# Patient Record
Sex: Male | Born: 2000 | Race: Black or African American | Hispanic: No | Marital: Married | Smoking: Never smoker
Health system: Southern US, Community
[De-identification: ages and names within clinical notes are randomized; demographics above are authoritative.]

## PROBLEM LIST (undated history)

## (undated) DIAGNOSIS — J45909 Unspecified asthma, uncomplicated: Secondary | ICD-10-CM

## (undated) DIAGNOSIS — K219 Gastro-esophageal reflux disease without esophagitis: Secondary | ICD-10-CM

## (undated) HISTORY — PX: TONSILLECTOMY: SUR1361

---
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
## 2019-01-18 ENCOUNTER — Encounter: Payer: Self-pay | Admitting: Emergency Medicine

## 2019-01-18 ENCOUNTER — Emergency Department
Admission: EM | Admit: 2019-01-18 | Discharge: 2019-01-18 | Disposition: A | Discharge status: Home / Self Care | Payer: Medicaid Other | Attending: Emergency Medicine | Admitting: Emergency Medicine

## 2019-01-18 ENCOUNTER — Other Ambulatory Visit: Payer: Self-pay

## 2019-01-18 ENCOUNTER — Emergency Department: Payer: Medicaid Other

## 2019-01-18 DIAGNOSIS — R079 Chest pain, unspecified: Secondary | ICD-10-CM | POA: Diagnosis not present

## 2019-01-18 DIAGNOSIS — J45909 Unspecified asthma, uncomplicated: Secondary | ICD-10-CM | POA: Diagnosis not present

## 2019-01-18 LAB — CBC
HCT: 43.1 % (ref 39.0–52.0)
Hemoglobin: 14.2 g/dL (ref 13.0–17.0)
MCH: 27 pg (ref 26.0–34.0)
MCHC: 32.9 g/dL (ref 30.0–36.0)
MCV: 81.9 fL (ref 80.0–100.0)
Platelets: 267 10*3/uL (ref 150–400)
RBC: 5.26 MIL/uL (ref 4.22–5.81)
RDW: 13.7 % (ref 11.5–15.5)
WBC: 8.3 10*3/uL (ref 4.0–10.5)
nRBC: 0 % (ref 0.0–0.2)

## 2019-01-18 LAB — BASIC METABOLIC PANEL
Anion gap: 8 (ref 5–15)
BUN: 16 mg/dL (ref 6–20)
CO2: 25 mmol/L (ref 22–32)
Calcium: 9.7 mg/dL (ref 8.9–10.3)
Chloride: 105 mmol/L (ref 98–111)
Creatinine, Ser: 0.78 mg/dL (ref 0.61–1.24)
GFR calc Af Amer: >60 mL/min (ref >60)
GFR calc non Af Amer: >60 mL/min (ref >60)
Glucose, Bld: 93 mg/dL (ref 70–99)
Potassium: 3.9 mmol/L (ref 3.5–5.1)
Sodium: 138 mmol/L (ref 135–145)

## 2019-01-18 LAB — TROPONIN I (HIGH SENSITIVITY): Troponin I (High Sensitivity): 4 ng/L (ref <18)

## 2019-01-18 MED ORDER — SODIUM CHLORIDE 0.9% FLUSH: 3.0000 mL | Freq: Once | INTRAVENOUS | Status: DC

## 2019-01-18 NOTE — ED Notes (Signed)
Pt waiting patiently for treatment room; awake and alert; 

## 2019-01-18 NOTE — ED Triage Notes (Signed)
Pt to ED via POV c/o chest pain that started about 2 hours PTA. Pt was seen 12/28/18 for same, was told it was acid reflux or asthma. Pt states that pain got worse after using medication for asthma. Pt denies any other symptoms. Pt is in NAD.

## 2019-01-18 NOTE — ED Notes (Signed)
Patient continues to wait patiently for treatment room;

## 2019-01-18 NOTE — ED Notes (Signed)
No peripheral IV placed this visit.   Discharge instructions reviewed with patient. Questions fielded by this RN. Patient verbalizes understanding of instructions. Patient discharged home in stable condition per williiams. No acute distress noted at time of discharge.

## 2019-01-18 NOTE — ED Provider Notes (Signed)
[facility] Emergency Department Provider Note       Time seen: ----------------------------------------- 9:55 PM on 01/18/2019 -----------------------------------------   I have reviewed the triage vital signs and the nursing notes.  HISTORY   Chief Complaint Chest Pain    HPI Ethan Brewer is a 18 y.o. male with a history of asthma, GERD who presents to the ED for chest pain.  Patient chest pain is started 2 hours prior to arrival.  He was seen similarly for same recently.  He denies any acid reflux or asthma.  Patient states he got worse after using his inhaler.  He denies any other complaints.  Past Medical History:  Diagnosis Date  . Asthma   . GERD (gastroesophageal reflux disease)     Patient Active Problem List   Diagnosis Date Noted  . Subscapular bursitis 12/05/2016  . 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 Abnormal ECG 07/30/2013    Past Surgical History:  Procedure Laterality Date  . TONSILLECTOMY      Allergies Patient has no known allergies.  Social History Social History   Tobacco Use  . Smoking status: Never Smoker  . Smokeless tobacco: Never Used  Substance Use Topics  . Alcohol use: Never    Frequency: Never  . Drug use: Never   Review of Systems Constitutional: Negative for fever. Cardiovascular: Positive for chest pain Respiratory: Negative for shortness of breath. Gastrointestinal: Negative for abdominal pain, vomiting and diarrhea. Musculoskeletal: Negative for back pain. Skin: Negative for rash. Neurological: Negative for headaches, focal weakness or numbness.  All systems negative/normal/unremarkable except as stated in the HPI  ____________________________________________   PHYSICAL EXAM:  VITAL SIGNS: ED Triage Vitals  Enc Vitals Group     BP 01/18/19 1709 131/66     Pulse Rate 01/18/19 1709 62     Resp 01/18/19 1709 16     Temp 01/18/19 1709 98.9 F (37.2 C)     Temp Source 01/18/19 1709 Oral     SpO2 01/18/19 1709 98 %     Weight --      Height --      Head Circumference --      Peak Flow --      Pain Score 01/18/19 1702 6     Pain Loc --      Pain Edu? --      Excl. in [place]? --    Constitutional: Alert and oriented. Well appearing and in no distress. Eyes: Conjunctivae are normal. Normal extraocular movements. Cardiovascular: Normal rate, regular rhythm. No murmurs, rubs, or gallops. Respiratory: Normal respiratory effort without tachypnea nor retractions. Breath sounds are clear and equal bilaterally. No wheezes/rales/rhonchi. Gastrointestinal: Soft and nontender. Normal bowel sounds Musculoskeletal: Nontender with normal range of motion in extremities. No lower extremity tenderness nor edema. Neurologic:  Normal speech and language. No gross focal neurologic deficits are appreciated.  Skin:  Skin is warm, dry and intact. No rash noted. Psychiatric: Mood and affect are normal. Speech and behavior are normal.  ____________________________________________  EKG: Interpreted by me.  Sinus rhythm with a rate of 74 bpm, rightward axis, normal QT  ____________________________________________  ED COURSE:  As part of my medical decision making, I reviewed the following data within the electronic MEDICAL RECORD NUMBER History obtained from family if available, nursing notes, old chart and ekg, as well as notes from prior ED visits. Patient presented for chest pain, we will assess with labs and imaging as indicated at this time.   Procedures  Ethan Brewer  was evaluated in Emergency Department on 01/18/2019 for the symptoms described in the history of present illness. He was evaluated in the context of the global COVID-19 pandemic, which necessitated consideration that the patient might be at risk for infection with the SARS-CoV-2 virus that causes COVID-19. Institutional protocols and algorithms that pertain to the evaluation of patients at risk for COVID-19 are in a state of rapid  change based on information released by regulatory bodies including the CDC and federal and state organizations. These policies and algorithms were followed during the patient's care in the ED.  ____________________________________________   LABS (pertinent positives/negatives)  Labs Reviewed  BASIC METABOLIC PANEL  CBC  TROPONIN I (HIGH SENSITIVITY)  TROPONIN I (HIGH SENSITIVITY)    RADIOLOGY  Chest x-ray is unremarkable  ____________________________________________   DIFFERENTIAL DIAGNOSIS   Musculoskeletal pain, anxiety, GERD, unstable angina unlikely  FINAL ASSESSMENT AND PLAN  Nonspecific chest pain   Plan: The patient had presented for likely noncardiac chest pain. Patient's labs are reassuring and he is low risk for ACS according to heart score. Patient's imaging was normal.  He is cleared for outpatient follow-up.   Laurence Aly, MD    Note: This note was generated in part or whole with voice recognition software. Voice recognition is usually quite accurate but there are transcription errors that can and very often do occur. I apologize for any typographical errors that were not detected and corrected.     Earleen Newport, MD 01/18/19 2156

## 2019-01-27 ENCOUNTER — Telehealth: Payer: Self-pay | Admitting: *Deleted

## 2019-01-27 NOTE — Telephone Encounter (Signed)
Patient has been advised to wear a mask for his appointment and to come alone.      COVID-19 Pre-Screening Questions:  . In the past 7 to 10 days have you had a cough,  shortness of breath, headache, congestion, fever (100 or greater) body aches, chills, sore throat, or sudden loss of taste or sense of smell? No . Have you been around anyone with known Covid 19. No . Have you been around anyone who is awaiting Covid 19 test results in the past 7 to 10 days? No . Have you been around anyone who has been exposed to Covid 19, or has mentioned symptoms of Covid 19 within the past 7 to 10 days? No  If you have any concerns/questions about symptoms patients report during screening (either on the phone or at threshold). Contact the provider seeing the patient or DOD for further guidance.  If neither are available contact a member of the leadership team.

## 2019-01-28 ENCOUNTER — Ambulatory Visit: Payer: Self-pay | Admitting: Cardiovascular Disease

## 2019-01-28 ENCOUNTER — Telehealth: Payer: Self-pay | Admitting: Cardiovascular Disease

## 2019-05-02 NOTE — Progress Notes (Deleted)
{Choose 1 Note Type (Telehealth Visit or Telephone Visit):[phone]}   Date:  05/02/2019   ID:  Margo Common, DOB Nov 24, 2000, MRN [id number]  {Patient [phone]::"Home"} {Provider Location:[phone]::"Home"}  PCP:  Ellene Route  Cardiologist:  Sanda Klein, MD  Electrophysiologist:  None   Evaluation Performed:  {Choose Visit Type:[phone]::"Follow-Up Visit"}  Chief Complaint:  Chest pain  History of Present Illness:    Ethan Brewer is a 18 y.o. male with a PMH significant for GERD and asthma. He has a strong family history of early CAD (mother with PCI age 61. He was first seen by Dr. Sallyanne Kuster in clinic following an ER visit for chest pain and palpitations. He complained of dizziness, no syncope. Echo 10/09/18 for palpitations and chest pain showed normal EF 60-65%, no wall motion abnormality, and no valvular disease. Heart monitor 11/11/18 without significant arrhythmias, no correlation with symptoms.   8 out of 18 ER visits in 2020 were for chest pain. He has an additional 8 ER visits at [place] listed in [place] in 2020. He was last seen at [facility] ED 01/18/19 for chest pain that worsened after using his inhaler. He has a history of negative troponins and normal EKG. CBC and BMP were unremarkable, as was his CXR. He was referred to GI after a GI cocktail relieved his chest pain during an ER visit on 12/12/18.  He presents today for follow up.   The patient {does/does not:200015} have symptoms concerning for COVID-19 infection (fever, chills, cough, or new shortness of breath).    Past Medical History:  Diagnosis Date  . Asthma   . GERD (gastroesophageal reflux disease)    Past Surgical History:  Procedure Laterality Date  . TONSILLECTOMY       No outpatient medications have been marked as taking for the 05/06/19 encounter (Appointment) with Ledora Bottcher, [place].     Allergies:   Patient has no known allergies.   Social History    Tobacco Use  . Smoking status: Never Smoker  . Smokeless tobacco: Never Used  Substance Use Topics  . Alcohol use: Never    Frequency: Never  . Drug use: Never     Family Hx: The patient's family history is not on file.  ROS:   Please see the history of present illness.    *** All other systems reviewed and are negative.   Prior CV studies:   The following studies were reviewed today:  ***  Labs/Other Tests and Data Reviewed:    EKG:  {EKG/Telemetry Strips Reviewed:[phone]}  Recent Labs: 10/06/2018: ALT 11 01/18/2019: BUN 16; Creatinine, Ser 0.78; Hemoglobin 14.2; Platelets 267; Potassium 3.9; Sodium 138   Recent Lipid Panel No results found for: CHOL, TRIG, HDL, CHOLHDL, LDLCALC, LDLDIRECT  Wt Readings from Last 3 Encounters:  12/28/18 240 lb (108.9 kg) (99 %, Z= 2.26)*  12/12/18 240 lb (108.9 kg) (99 %, Z= 2.26)*  10/05/18 257 lb (116.6 kg) (>99 %, Z= 2.52)*   * Growth percentiles are based on CDC (Boys, 2-20 Years) data.     Objective:    Vital Signs:  There were no vitals taken for this visit.   {HeartCare Virtual Exam (Optional):[phone]::"VITAL SIGNS:  reviewed"}  ASSESSMENT & PLAN:    1. ***  COVID-19 Education: The signs and symptoms of COVID-19 were discussed with the patient and how to seek care for testing (follow up with PCP or arrange E-visit).  ***The importance of social distancing was discussed today.  Time:   Today, I  have spent *** minutes with the patient with telehealth technology discussing the above problems.     Medication Adjustments/Labs and Tests Ordered: Current medicines are reviewed at length with the patient today.  Concerns regarding medicines are outlined above.   Tests Ordered: No orders of the defined types were placed in this encounter.   Medication Changes: No orders of the defined types were placed in this encounter.   Follow Up:  {F/U Format:[phone]} {follow up:15908}  Signed, Marcelino Duster,  PA  05/02/2019 4:44 PM    [facility]

## 2019-05-06 ENCOUNTER — Telehealth: Payer: Medicaid Other | Admitting: Physician Assistant

## 2019-06-20 ENCOUNTER — Encounter: Payer: Self-pay | Admitting: Cardiovascular Disease

## 2019-06-20 ENCOUNTER — Ambulatory Visit: Payer: Medicaid Other | Admitting: Cardiovascular Disease

## 2019-06-20 ENCOUNTER — Other Ambulatory Visit: Payer: Self-pay

## 2019-06-20 VITALS — BP 132/83 | HR 82 | Temp 98.0°F | Ht 69.0 in | Wt 257.0 lb

## 2019-06-20 DIAGNOSIS — Z8249 Family history of ischemic heart disease and other diseases of the circulatory system: Secondary | ICD-10-CM

## 2019-06-20 DIAGNOSIS — E785 Hyperlipidemia, unspecified: Secondary | ICD-10-CM | POA: Diagnosis not present

## 2019-06-20 DIAGNOSIS — R9431 Abnormal electrocardiogram [ECG] [EKG]: Secondary | ICD-10-CM | POA: Diagnosis not present

## 2019-06-20 DIAGNOSIS — R42 Dizziness and giddiness: Secondary | ICD-10-CM

## 2019-06-20 DIAGNOSIS — Z6835 Body mass index (BMI) 35.0-35.9, adult: Secondary | ICD-10-CM

## 2019-06-20 NOTE — Progress Notes (Signed)
Cardiology Office Note:    Date:  06/20/2019   ID:  Ethan Brewer, DOB January 31, 2001, MRN [id number]  PCP:  Ellene Route  Cardiologist:  Sanda Klein, MD  Electrophysiologist:  None   Referring MD: Ellene Route   Chief Complaint  Patient presents with  . Palpitations    History of Present Illness:    Ethan Brewer is a 18 y.o. male with complaints of dizziness and palpitations that occur independently of each other.  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  The complaints passes spontaneously and he has not had syncope or falls.  Independently, he has occasional isolated skipped beats.  He never has sustained arrhythmia that he is aware of.  He has not had exertional dyspnea or chest discomfort.  He denies edema or claudication.  He has occasional occipital headaches.  When he checks his blood pressure at home is consistently in the 120s/70s.  He also monitors his blood glucose with his mother's glucometer and it has been normal.  Few months ago his echocardiogram showed normal findings, including absence of LVH or of diastolic dysfunction.  All chambers are normal in size, left ventricular systolic function was normal and there were no serious valvular problems.  He wore an arrhythmia monitor that showed completely normal rhythm.  Subjective palpitations did not correlate with any rhythm abnormalities.   There is no family history of cardiomyopathy or unexplained sudden death, but there is a very strong family history of premature CAD.  His mother had a coronary stent around the age of 32.  Both his maternal grandparents had myocardial infarction in their 77s and his grandfather had a defibrillator before he died.  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  His ECG a few months ago showed sinus rhythm with a  rightward axis, borderline voltage criteria for left ventricular hypertrophy (which might be masked by severe obesity) and isolated T wave inversion in leads III and aVF.  Today's tracing is slightly different.  It shows sinus rhythm with incomplete right bundle branch block and only borderline voltage criteria for LVH, and the inferior repolarization abnormalities are gone.  QTc remains normal at 401 ms  Past Medical History:  Diagnosis Date  . Asthma   . GERD (gastroesophageal reflux disease)     Past Surgical History:  Procedure Laterality Date  . TONSILLECTOMY      Current Medications: Current Meds  Medication Sig  . acetaminophen (TYLENOL) 325 MG tablet Take 650 mg by mouth every 6 (six) hours as needed.  Marland Kitchen albuterol (VENTOLIN HFA) 108 (90 Base) MCG/ACT inhaler Inhale 2 puffs into the lungs every 4 (four) hours as needed for wheezing or shortness of breath.  Marland Kitchen LORazepam (ATIVAN) 1 MG tablet TAKE 1 TABLET BY MOUTH EVERY 8 HOURS AS NEEDED FOR ANXIETY  . montelukast (SINGULAIR) 10 MG tablet Take 10 mg by mouth at bedtime.     Allergies:   Patient has no known allergies.   Social History   Socioeconomic History  . Marital status: Married    Spouse name: Not on file  . Number of children: Not on file  . Years of education: Not on file  . Highest education level: Not on file  Occupational History  . Not on file  Tobacco Use  . Smoking status: Never Smoker  . Smokeless tobacco: Never Used  Substance and Sexual Activity  . Alcohol use: Never  . Drug use: Never  . Sexual activity: Not on file  Other Topics Concern  . Not on file  Social History Narrative  . Not on file   Social Determinants of Health   Financial Resource Strain:   . Difficulty of Paying Living Expenses: Not on file  Food Insecurity:   . Worried About Programme researcher, broadcasting/film/video in the Last Year: Not on file  . Ran Out of Food in the Last Year: Not on file  Transportation Needs:   . Lack of Transportation  (Medical): Not on file  . Lack of Transportation (Non-Medical): Not on file  Physical Activity:   . Days of Exercise per Week: Not on file  . Minutes of Exercise per Session: Not on file  Stress:   . Feeling of Stress : Not on file  Social Connections:   . Frequency of Communication with Friends and Family: Not on file  . Frequency of Social Gatherings with Friends and Family: Not on file  . Attends Religious Services: Not on file  . Active Member of Clubs or Organizations: Not on file  . Attends Banker Meetings: Not on file  . Marital Status: Not on file     Family History: The patient's mother and both his maternal grandparents have early onset CAD ROS:   Please see the history of present illness.    All other systems are reviewed and are negative.   EKGs/Labs/Other Studies Reviewed:    The following studies were reviewed today: Echocardiogram April 2020  1. The left ventricle has normal systolic function with an ejection fraction of 60-65%. The cavity size was normal. Left ventricular diastolic parameters were normal.  2. The right ventricle has normal systolic function. The cavity was normal. There is no increase in right ventricular wall thickness. Normal RVSP  3. There is mild dilatation of the aortic root, 3.5 cm  4. The aortic valve is tricuspid. Event monitor May 2020  Mediocre recording quality. Only 46% interpretable (28% uninterpretable due to artifact, not worn remainder of the time).  Normal sinus rhythm with sinus arrhythmia and normal circadian variation.  A single PAC is seen during the entire tracing. There are no PVCs. There is no evidence of complex atrial or ventricular arrhythmia.  There is no correlation between the rhythm and the patient's symptoms. Symptoms were described only sinus bradycardia, normal sinus rhythm and mild sinus tachycardia.   Normal event monitor.  There is no correlation between the reports of palpitations and the  objective rhythm recorded. EKG:  EKG is ordered today.  It shows sinus rhythm, incomplete right bundle branch block, borderline voltage criteria for LVH, normal repolarization pattern, QTc 401 ms Recent Labs: 10/06/2018: ALT 11 01/18/2019: BUN 16; Creatinine, Ser 0.78; Hemoglobin 14.2; Platelets 267; Potassium 3.9; Sodium 138  Recent Lipid Panel No results found for: CHOL, TRIG, HDL, CHOLHDL, VLDL, LDLCALC, LDLDIRECT  Physical Exam:    VS:  BP 132/83   Pulse 82   Temp 98 F (36.7 C)   Ht  (1.753 m)   Wt 257 lb (116.6 kg)   SpO2 96%   BMI 37.95 kg/m     Wt Readings from Last 3 Encounters:  06/20/19 257 lb (116.6 kg) (>99 %, Z= 2.51)*  12/28/18 240 lb (108.9 kg) (99 %, Z= 2.26)*  12/12/18 240 lb (108.9 kg) (99 %, Z= 2.26)*   * Growth percentiles are based on CDC (Boys,  2-20 Years) data.      General: Alert, oriented x3, no distress, he appears muscular and fit, but also obese Head: no evidence of trauma, PERRL, EOMI, no exophtalmos or lid lag, no myxedema, no xanthelasma; normal ears, nose and oropharynx Neck: normal jugular venous pulsations and no hepatojugular reflux; brisk carotid pulses without delay and no carotid bruits Chest: clear to auscultation, no signs of consolidation by percussion or palpation, normal fremitus, symmetrical and full respiratory excursions Cardiovascular: normal position and quality of the apical impulse, regular rhythm, normal first and second heart sounds, no murmurs, rubs or gallops Abdomen: no tenderness or distention, no masses by palpation, no abnormal pulsatility or arterial bruits, normal bowel sounds, no hepatosplenomegaly Extremities: no clubbing, cyanosis or edema; 2+ radial, ulnar and brachial pulses bilaterally; 2+ right femoral, posterior tibial and dorsalis pedis pulses; 2+ left femoral, posterior tibial and dorsalis pedis pulses; no subclavian or femoral bruits Neurological: grossly nonfocal Psych: Normal mood and  affect   ASSESSMENT:    1. Abnormal ECG   2. Hyperlipidemia, unspecified hyperlipidemia type   3. Family history of premature CAD   4. Severe obesity (BMI 35.0-35.9 with comorbidity) (HCC)   5. Dizziness    PLAN:    In order of problems listed above:  1. Abn ECG: Changes are less prominent on today's tracing.  There was no evidence of hypertrophic cardiomyopathy or really any other abnormalities on his echocardiogram. 2. FHx CAD: Quite a remarkable family history of premature onset CAD, including a first-degree male relatives with myocardial infarction in her 4240s.   Strongly encouraged him to lose weight and eat a much healthier diet.  He has not yet had his lipid profile and we will check it today. 3. Severe obesity: Reviewed important changes to his diet.  He is very physically active, but his portion sizes and fat content are not the healthiest. 4. Dizziness: Does not appear to be correlated with any cardiovascular problems.  No arrhythmia was noted on his event monitor.   Medication Adjustments/Labs and Tests Ordered: Current medicines are reviewed at length with the patient today.  Concerns regarding medicines are outlined above.  Orders Placed This Encounter  Procedures  . Lipid Profile  . Comprehensive Metabolic Panel (CMET)  . EKG 12-Lead   No orders of the defined types were placed in this encounter.   Patient Instructions  Medication Instructions:  NONE *If you need a refill on your cardiac medications before your next appointment, please call your pharmacy*  Lab Work: CMET/ Lipids If you have labs (blood work) drawn today and your tests are completely normal, you will receive your results only by: Marland Kitchen. MyChart Message (if you have MyChart) OR . A paper copy in the mail If you have any lab test that is abnormal or we need to change your treatment, we will call you to review the results.  Testing/Procedures: none at this time  Follow-Up: At [facility], you  and your health needs are our priority.  As part of our continuing mission to provide you with exceptional heart care, we have created designated Provider Care Teams.  These Care Teams include your primary Cardiologist (physician) and Advanced Practice Providers (APPs -  Physician Assistants and Nurse Practitioners) who all work together to provide you with the care you need, when you need it.  Your next appointment:   12 month(s)  The format for your next appointment:   In Person  Provider:   You may see Thurmon FairMihai Kobee Medlen, MD  or one of the following Advanced Practice Providers on your designated Care Team:    Azalee Course, PA-C  Micah Flesher, [place] or   Judy Pimple, [place]   Other Instructions      Signed, Thurmon Fair, MD  06/20/2019 2:12 PM    [facility]

## 2019-06-20 NOTE — Patient Instructions (Signed)
Medication Instructions:  NONE *If you need a refill on your cardiac medications before your next appointment, please call your pharmacy*  Lab Work: CMET/ Lipids If you have labs (blood work) drawn today and your tests are completely normal, you will receive your results only by: Marland Kitchen MyChart Message (if you have MyChart) OR . A paper copy in the mail If you have any lab test that is abnormal or we need to change your treatment, we will call you to review the results.  Testing/Procedures: none at this time  Follow-Up: At [facility], you and your health needs are our priority.  As part of our continuing mission to provide you with exceptional heart care, we have created designated Provider Care Teams.  These Care Teams include your primary Cardiologist (physician) and Advanced Practice Providers (APPs -  Physician Assistants and Nurse Practitioners) who all work together to provide you with the care you need, when you need it.  Your next appointment:   12 month(s)  The format for your next appointment:   In Person  Provider:   You may see Sanda Klein, MD or one of the following Advanced Practice Providers on your designated Care Team:    Almyra Deforest, PA-C  Fabian Sharp, [place] or   Roby Lofts, [place]   Other Instructions

## 2019-06-21 LAB — COMPREHENSIVE METABOLIC PANEL
ALT: 14 IU/L (ref 0–44)
AST: 23 IU/L (ref 0–40)
Albumin/Globulin Ratio: 1.9 (ref 1.2–2.2)
Albumin: 4.7 g/dL (ref 4.1–5.2)
Alkaline Phosphatase: 85 IU/L (ref 39–117)
BUN/Creatinine Ratio: 11 (ref 9–20)
BUN: 11 mg/dL (ref 6–20)
Bilirubin Total: 0.5 mg/dL (ref 0.0–1.2)
CO2: 25 mmol/L (ref 20–29)
Calcium: 10.1 mg/dL (ref 8.7–10.2)
Chloride: 101 mmol/L (ref 96–106)
Creatinine, Ser: 0.98 mg/dL (ref 0.76–1.27)
GFR calc Af Amer: 129 mL/min/{1.73_m2} (ref >59)
GFR calc non Af Amer: 111 mL/min/{1.73_m2} (ref >59)
Globulin, Total: 2.5 g/dL (ref 1.5–4.5)
Glucose: 74 mg/dL (ref 65–99)
Potassium: 4.5 mmol/L (ref 3.5–5.2)
Sodium: 140 mmol/L (ref 134–144)
Total Protein: 7.2 g/dL (ref 6.0–8.5)

## 2019-06-21 LAB — LIPID PANEL
Chol/HDL Ratio: 3.2 ratio (ref 0.0–5.0)
Cholesterol, Total: 174 mg/dL — ABNORMAL HIGH (ref 100–169)
HDL: 54 mg/dL (ref >39)
LDL Chol Calc (NIH): 105 mg/dL (ref 0–109)
Triglycerides: 81 mg/dL (ref 0–89)
VLDL Cholesterol Cal: 15 mg/dL (ref 5–40)

## 2020-01-07 ENCOUNTER — Emergency Department: Payer: Medicaid Other

## 2020-01-07 ENCOUNTER — Observation Stay
Admission: EM | Admit: 2020-01-07 | Discharge: 2020-01-08 | Disposition: A | Discharge status: Home / Self Care | Payer: Medicaid Other | Attending: Internal Medicine | Admitting: Internal Medicine

## 2020-01-07 ENCOUNTER — Other Ambulatory Visit: Payer: Self-pay

## 2020-01-07 ENCOUNTER — Encounter: Payer: Self-pay | Admitting: Emergency Medicine

## 2020-01-07 DIAGNOSIS — Z7952 Long term (current) use of systemic steroids: Secondary | ICD-10-CM | POA: Insufficient documentation

## 2020-01-07 DIAGNOSIS — Z888 Allergy status to other drugs, medicaments and biological substances status: Secondary | ICD-10-CM | POA: Insufficient documentation

## 2020-01-07 DIAGNOSIS — Z20822 Contact with and (suspected) exposure to covid-19: Secondary | ICD-10-CM | POA: Insufficient documentation

## 2020-01-07 DIAGNOSIS — J390 Retropharyngeal and parapharyngeal abscess: Secondary | ICD-10-CM | POA: Diagnosis present

## 2020-01-07 DIAGNOSIS — R030 Elevated blood-pressure reading, without diagnosis of hypertension: Secondary | ICD-10-CM | POA: Insufficient documentation

## 2020-01-07 DIAGNOSIS — D72829 Elevated white blood cell count, unspecified: Secondary | ICD-10-CM | POA: Insufficient documentation

## 2020-01-07 DIAGNOSIS — J020 Streptococcal pharyngitis: Principal | ICD-10-CM | POA: Insufficient documentation

## 2020-01-07 DIAGNOSIS — Z79899 Other long term (current) drug therapy: Secondary | ICD-10-CM | POA: Insufficient documentation

## 2020-01-07 DIAGNOSIS — Z7901 Long term (current) use of anticoagulants: Secondary | ICD-10-CM | POA: Diagnosis not present

## 2020-01-07 DIAGNOSIS — J45909 Unspecified asthma, uncomplicated: Secondary | ICD-10-CM | POA: Insufficient documentation

## 2020-01-07 DIAGNOSIS — K219 Gastro-esophageal reflux disease without esophagitis: Secondary | ICD-10-CM | POA: Diagnosis not present

## 2020-01-07 LAB — CBC WITH DIFFERENTIAL/PLATELET
Abs Immature Granulocytes: 0.04 10*3/uL (ref 0.00–0.07)
Basophils Absolute: 0 10*3/uL (ref 0.0–0.1)
Basophils Relative: 0 %
Eosinophils Absolute: 0 10*3/uL (ref 0.0–0.5)
Eosinophils Relative: 0 %
HCT: 41.1 % (ref 39.0–52.0)
Hemoglobin: 13.8 g/dL (ref 13.0–17.0)
Immature Granulocytes: 0 %
Lymphocytes Relative: 19 %
Lymphs Abs: 2.6 10*3/uL (ref 0.7–4.0)
MCH: 27.4 pg (ref 26.0–34.0)
MCHC: 33.6 g/dL (ref 30.0–36.0)
MCV: 81.5 fL (ref 80.0–100.0)
Monocytes Absolute: 1.1 10*3/uL — ABNORMAL HIGH (ref 0.1–1.0)
Monocytes Relative: 8 %
Neutro Abs: 9.6 10*3/uL — ABNORMAL HIGH (ref 1.7–7.7)
Neutrophils Relative %: 73 %
Platelets: 250 10*3/uL (ref 150–400)
RBC: 5.04 MIL/uL (ref 4.22–5.81)
RDW: 13.8 % (ref 11.5–15.5)
WBC: 13.4 10*3/uL — ABNORMAL HIGH (ref 4.0–10.5)
nRBC: 0 % (ref 0.0–0.2)

## 2020-01-07 LAB — BASIC METABOLIC PANEL
Anion gap: 6 (ref 5–15)
BUN: 13 mg/dL (ref 6–20)
CO2: 27 mmol/L (ref 22–32)
Calcium: 9.3 mg/dL (ref 8.9–10.3)
Chloride: 105 mmol/L (ref 98–111)
Creatinine, Ser: 0.94 mg/dL (ref 0.61–1.24)
GFR calc Af Amer: >60 mL/min (ref >60)
GFR calc non Af Amer: >60 mL/min (ref >60)
Glucose, Bld: 105 mg/dL — ABNORMAL HIGH (ref 70–99)
Potassium: 3.8 mmol/L (ref 3.5–5.1)
Sodium: 138 mmol/L (ref 135–145)

## 2020-01-07 LAB — HIV ANTIBODY (ROUTINE TESTING W REFLEX): HIV Screen 4th Generation wRfx: NONREACTIVE

## 2020-01-07 LAB — MONONUCLEOSIS SCREEN: Mono Screen: NEGATIVE

## 2020-01-07 LAB — SARS CORONAVIRUS 2 BY RT PCR (HOSPITAL ORDER, PERFORMED IN ~~LOC~~ HOSPITAL LAB): SARS Coronavirus 2: NEGATIVE

## 2020-01-07 LAB — GROUP A STREP BY PCR: Group A Strep by PCR: DETECTED — AB

## 2020-01-07 MED ORDER — SODIUM CHLORIDE 0.9 % IV SOLN
3.0000 g | Freq: Once | INTRAVENOUS | Status: AC
  Administered 2020-01-07: 3 g via INTRAVENOUS
  Filled 2020-01-07: qty 8

## 2020-01-07 MED ORDER — SODIUM CHLORIDE 0.9 % IV SOLN
3.0000 g | Freq: Four times a day (QID) | INTRAVENOUS | Status: DC
  Administered 2020-01-07 – 2020-01-08 (×4): 3 g via INTRAVENOUS
  Filled 2020-01-07: qty 3
  Filled 2020-01-07 (×2): qty 8
  Filled 2020-01-07: qty 3
  Filled 2020-01-07 (×3): qty 8

## 2020-01-07 MED ORDER — ENOXAPARIN SODIUM 40 MG/0.4ML ~~LOC~~ SOLN
40.0000 mg | SUBCUTANEOUS | Status: DC
  Administered 2020-01-07: 40 mg via SUBCUTANEOUS
  Filled 2020-01-07: qty 0.4

## 2020-01-07 MED ORDER — LIDOCAINE-PRILOCAINE 2.5-2.5 % EX CREA: TOPICAL_CREAM | Freq: Once | CUTANEOUS | Status: DC

## 2020-01-07 MED ORDER — KETOROLAC TROMETHAMINE 30 MG/ML IJ SOLN: 30.0000 mg | Freq: Four times a day (QID) | INTRAMUSCULAR | Status: DC | PRN

## 2020-01-07 MED ORDER — MONTELUKAST SODIUM 10 MG PO TABS: 10.0000 mg | ORAL_TABLET | Freq: Every day | ORAL | Status: DC

## 2020-01-07 MED ORDER — PANTOPRAZOLE SODIUM 40 MG PO TBEC
40.0000 mg | DELAYED_RELEASE_TABLET | Freq: Every day | ORAL | Status: DC
  Administered 2020-01-07 – 2020-01-08 (×2): 40 mg via ORAL
  Filled 2020-01-07 (×2): qty 1

## 2020-01-07 MED ORDER — IBUPROFEN 400 MG PO TABS: 400.0000 mg | ORAL_TABLET | Freq: Four times a day (QID) | ORAL | Status: DC | PRN

## 2020-01-07 MED ORDER — IOHEXOL 300 MG/ML  SOLN
75.0000 mL | Freq: Once | INTRAMUSCULAR | Status: AC | PRN
  Administered 2020-01-07: 75 mL via INTRAVENOUS
  Filled 2020-01-07: qty 75

## 2020-01-07 MED ORDER — ALBUTEROL SULFATE HFA 108 (90 BASE) MCG/ACT IN AERS
2.0000 | INHALATION_SPRAY | RESPIRATORY_TRACT | Status: DC | PRN
  Filled 2020-01-07: qty 6.7

## 2020-01-07 MED ORDER — GADOBUTROL 1 MMOL/ML IV SOLN
10.0000 mL | Freq: Once | INTRAVENOUS | Status: AC | PRN
  Administered 2020-01-07: 10 mL via INTRAVENOUS
  Filled 2020-01-07: qty 10

## 2020-01-07 MED ORDER — DEXAMETHASONE SODIUM PHOSPHATE 10 MG/ML IJ SOLN
10.0000 mg | Freq: Once | INTRAMUSCULAR | Status: AC
  Administered 2020-01-07: 10 mg via INTRAVENOUS
  Filled 2020-01-07: qty 1

## 2020-01-07 MED ORDER — MORPHINE SULFATE (PF) 4 MG/ML IV SOLN
4.0000 mg | Freq: Once | INTRAVENOUS | Status: AC
  Administered 2020-01-07: 4 mg via INTRAVENOUS
  Filled 2020-01-07: qty 1

## 2020-01-07 MED ORDER — DEXAMETHASONE SODIUM PHOSPHATE 10 MG/ML IJ SOLN
4.0000 mg | Freq: Four times a day (QID) | INTRAMUSCULAR | Status: DC
  Administered 2020-01-07 – 2020-01-08 (×3): 4 mg via INTRAVENOUS
  Filled 2020-01-07 (×3): qty 1

## 2020-01-07 MED ORDER — ONDANSETRON HCL 4 MG/2ML IJ SOLN
4.0000 mg | Freq: Once | INTRAMUSCULAR | Status: AC
  Administered 2020-01-07: 4 mg via INTRAVENOUS
  Filled 2020-01-07: qty 2

## 2020-01-07 NOTE — Consult Note (Signed)
Pharmacy Antibiotic Note  Ethan Brewer is a 19 y.o. male admitted on 01/07/2020 with pneumonia.  Pharmacy has been consulted for Unasyn dosing.  Plan: Unasyn 3 g q6H  Height: 5\' 9"  (175.3 cm) Weight: 108.9 kg (240 lb) IBW/kg (Calculated) : 70.7  Temp (24hrs), Avg:99.1 F (37.3 C), Min:99.1 F (37.3 C), Max:99.1 F (37.3 C)  Recent Labs  Lab 01/07/20 1049  WBC 13.4*  CREATININE 0.94    Estimated Creatinine Clearance: 152.5 mL/min (by C-G formula based on SCr of 0.94 mg/dL).    Allergies  Allergen Reactions  . Prednisone Palpitations    Rapid heart rate    Antimicrobials this admission: 6/30 Unasyn >>   Dose adjustments this admission: None  Microbiology results: None  Thank you for allowing pharmacy to be a part of this patient's care.  7/30, PharmD, BCPS 01/07/2020 4:09 PM

## 2020-01-07 NOTE — Progress Notes (Signed)
Had told wife earlier that she couldn't stay the night, went back and wife was still in the chair at bedside; told her again, that she couldn't spend the night; She proceeded to tell this RN that ".Peggye Form called every one and no one can come get me.  We came over here in a straight-drive car and I don't know how to drive it..."  Elnita Maxwell, [facility] notified; she will stay as a curtesy, d/t late hour, but will not be able to stay after visiting hours, going forward. Windy Carina, RN 11:44 PM 01/07/2020

## 2020-01-07 NOTE — H&P (Signed)
History and Physical    Ethan Brewer HEN:[id number] DOB: [birth year] DOA: 01/07/2020  PCP: Nira Retort   Patient coming from: Home  I have personally briefly reviewed patient's old medical records in [facility]  Chief Complaint: Sore throat and headache.  HPI: Ethan Brewer is a 19 y.o. male with medical history significant of GERD and asthma came to ED with complaint of sore throat and neck pain for the past few days.  According to patient he was working on a car and initially thought that neck pain is secondary to his work.  Denies any injuries.  Later yesterday he developed some sore throat and difficulty swallowing. Denies any fever or chills.  Do have some headache.  No nausea or vomiting. Denies any generalized body aches.  No other upper respiratory symptoms.  No urinary symptoms.  No recent sick contacts.  No recent change in his appetite or bowel habits.  Having some difficulty with swallowing due to pain.  ED Course: On arrival he was hemodynamically stable with temperature of 99.1, heart rate 80, respiratory rate 16, mildly elevated blood pressure at 142/69, saturating well on room air.  Labs were positive for only mild leukocytosis.  Group A strep by PCR was positive.  COVID-19 pending.  CT soft tissue neck shows a significant retropharyngeal fluid collection spanning the C2-C5.  There was some concern of early osteomyelitis but subsequent MRI of cervical spine was negative for osteomyelitis.  ENT was consulted from ED and they advised admission with IV antibiotics and a possible abscess drainage.  Review of Systems: As per HPI otherwise 10 point review of systems negative.   Past Medical History:  Diagnosis Date  . Asthma   . GERD (gastroesophageal reflux disease)     Past Surgical History:  Procedure Laterality Date  . TONSILLECTOMY       reports that he has never smoked. He has never used smokeless tobacco. He reports that he does not drink alcohol  and does not use drugs.  Allergies  Allergen Reactions  . Prednisone Palpitations    Rapid heart rate    History reviewed. No pertinent family history.  Prior to Admission medications   Medication Sig Start Date End Date Taking? Authorizing Provider  acetaminophen (TYLENOL) 325 MG tablet Take 650 mg by mouth every 6 (six) hours as needed.    [provider]  albuterol (VENTOLIN HFA) 108 (90 Base) MCG/ACT inhaler Inhale 2 puffs into the lungs every 4 (four) hours as needed for wheezing or shortness of breath. 12/06/18   Withrow, Everardo All, FNP  famotidine (PEPCID) 20 MG tablet Take 20 mg by mouth daily. 12/10/18 01/09/19  [provider]  LORazepam (ATIVAN) 1 MG tablet TAKE 1 TABLET BY MOUTH EVERY 8 HOURS AS NEEDED FOR ANXIETY 09/30/18   [provider]  montelukast (SINGULAIR) 10 MG tablet Take 10 mg by mouth at bedtime. 11/26/18 11/26/19  [provider]  omeprazole (PRILOSEC) 40 MG capsule Take 1 capsule (40 mg total) by mouth daily for 30 days. 12/12/18 01/11/19  Darci Current, MD    Physical Exam: Vitals:   01/07/20 1006 01/07/20 1225 01/07/20 1504  BP: (!) 158/81 (!) 150/78 (!) 142/69  Pulse: 85 80 89  Resp: 16 16 18   Temp: 99.1 F (37.3 C)    TempSrc: Oral    SpO2: 99% 99% 97%  Weight: 108.9 kg    Height: 5\' 9"  (1.753 m)      General: Vital signs reviewed.  Patient  is well-developed and well-nourished, in no acute distress and cooperative with exam.  Head: Normocephalic and atraumatic. Eyes: EOMI, conjunctivae normal, no scleral icterus.  ENMT: Mucous membranes are moist. Posterior pharynx with some edema and erythema. Neck: Supple, trachea midline, normal ROM, no JVD,  Cardiovascular: RRR, S1 normal, S2 normal, no murmurs, gallops, or rubs. Pulmonary/Chest: Clear to auscultation bilaterally, no wheezes, rales, or rhonchi. Abdominal: Soft, non-tender, non-distended, BS +,  Extremities: No lower extremity edema bilaterally,  pulses symmetric and  intact bilaterally. No cyanosis or clubbing. Neurological: A&O x3, Strength is normal and symmetric bilaterally, cranial nerve II-XII are grossly intact, no focal motor deficit, sensory intact to light touch bilaterally.  Skin: Warm, dry and intact. No rashes or erythema. Psychiatric: Normal mood and affect. speech and behavior is normal. Cognition and memory are normal.   Labs on Admission: I have personally reviewed following labs and imaging studies  CBC: Recent Labs  Lab 01/07/20 1049  WBC 13.4*  NEUTROABS 9.6*  HGB 13.8  HCT 41.1  MCV 81.5  PLT 250   Basic Metabolic Panel: Recent Labs  Lab 01/07/20 1049  NA 138  K 3.8  CL 105  CO2 27  GLUCOSE 105*  BUN 13  CREATININE 0.94  CALCIUM 9.3   GFR: Estimated Creatinine Clearance: 152.5 mL/min (by C-G formula based on SCr of 0.94 mg/dL). Liver Function Tests: No results for input(s): AST, ALT, ALKPHOS, BILITOT, PROT, ALBUMIN in the last 168 hours. No results for input(s): LIPASE, AMYLASE in the last 168 hours. No results for input(s): AMMONIA in the last 168 hours. Coagulation Profile: No results for input(s): INR, PROTIME in the last 168 hours. Cardiac Enzymes: No results for input(s): CKTOTAL, CKMB, CKMBINDEX, TROPONINI in the last 168 hours. BNP (last 3 results) No results for input(s): PROBNP in the last 8760 hours. HbA1C: No results for input(s): HGBA1C in the last 72 hours. CBG: No results for input(s): GLUCAP in the last 168 hours. Lipid Profile: No results for input(s): CHOL, HDL, LDLCALC, TRIG, CHOLHDL, LDLDIRECT in the last 72 hours. Thyroid Function Tests: No results for input(s): TSH, T4TOTAL, FREET4, T3FREE, THYROIDAB in the last 72 hours. Anemia Panel: No results for input(s): VITAMINB12, FOLATE, FERRITIN, TIBC, IRON, RETICCTPCT in the last 72 hours. Urine analysis:    Component Value Date/Time   COLORURINE YELLOW (A) 09/09/2018 0016   APPEARANCEUR CLEAR (A) 09/09/2018 0016   LABSPEC 1.017  09/09/2018 0016   PHURINE 6.0 09/09/2018 0016   GLUCOSEU NEGATIVE 09/09/2018 0016   HGBUR NEGATIVE 09/09/2018 0016   BILIRUBINUR NEGATIVE 09/09/2018 0016   KETONESUR NEGATIVE 09/09/2018 0016   PROTEINUR NEGATIVE 09/09/2018 0016   NITRITE NEGATIVE 09/09/2018 0016   LEUKOCYTESUR NEGATIVE 09/09/2018 0016    Radiological Exams on Admission: CT Soft Tissue Neck W Contrast  Result Date: 01/07/2020 CLINICAL DATA:  Epiglottitis or tonsillitis suspected. Additional history provided: Patient reports right-sided neck pain and trouble swallowing with low grade fever. EXAM: CT NECK WITH CONTRAST TECHNIQUE: Multidetector CT imaging of the neck was performed using the standard protocol following the bolus administration of intravenous contrast. CONTRAST:  62mL OMNIPAQUE IOHEXOL 300 MG/ML  SOLN COMPARISON:  No pertinent prior studies available for comparison. FINDINGS: Pharynx and larynx: There is no definite tonsillar swelling or discrete mass. There are retropharyngeal inflammatory changes. Prominent right retropharyngeal lymphadenopathy with nodes measuring up 1.8 cm in short axis (series 2, image 38) (series 5, image 55). There is a retropharyngeal fluid collection spanning the C2-C5 levels which measures 1.6 x 2.4  x 6.6 cm (AP x TV x CC) (for instance as seen on series 2, image 56) (series 5, image 65). This collection demonstrates subtle peripheral enhancement. The retropharyngeal lymph nodes result in mild effacement of the oropharyngeal airway. Salivary glands: No inflammation, mass, or stone. Thyroid: Unremarkable Lymph nodes: Please see discussion of retropharyngeal lymphadenopathy above. There is also bilateral upper cervical chain lymphadenopathy. Most notably a left level II lymph node measures 1.8 cm in short axis (series 5, image 85). Vascular: The major vascular structures of the neck are patent. Limited intracranial: No acute intracranial abnormality identified. Visualized orbits: Unremarkable.  Mastoids and visualized paranasal sinuses: Scattered trace paranasal sinus mucosal thickening at the imaged levels. No significant mastoid effusion. Skeleton: No appreciable acute bony abnormality or aggressive osseous lesion. Upper chest: No consolidation within the imaged lung apices. These results were called by telephone at the time of interpretation on 01/07/2020 at 12:10 pm to provider Greig RightSUSAN FISHER , who verbally acknowledged these results. IMPRESSION: Findings consistent with retropharyngeal infection. There is a 1.6 x 2.4 x 6.6 cm retropharyngeal fluid collection spanning the C2-C5 levels. This collection demonstrates thin peripheral enhancement and may reflect an effusion or early abscess. Additionally, there is prominent right retropharyngeal lymphadenopathy with resultant mild oropharyngeal airway effacement. This retropharyngeal infection may be secondary to pharyngitis. Early cervical spine discitis/osteomyelitis may be occult by CT and contrast-enhanced cervical spine MRI is recommended for further evaluation. Bilateral upper cervical lymphadenopathy, likely reactive. Scattered trace paranasal sinus mucosal thickening. Electronically Signed   By: Jackey LogeKyle  Golden DO   On: 01/07/2020 12:10   MR CERVICAL SPINE W WO CONTRAST  Result Date: 01/07/2020 CLINICAL DATA:  Retropharyngeal fluid collection EXAM: MRI CERVICAL SPINE WITHOUT AND WITH CONTRAST TECHNIQUE: Multiplanar and multiecho pulse sequences of the cervical spine, to include the craniocervical junction and cervicothoracic junction, were obtained without and with intravenous contrast. CONTRAST:  10mL GADAVIST GADOBUTROL 1 MMOL/ML IV SOLN COMPARISON:  Correlation made with prior neck CT FINDINGS: Alignment: Anteroposterior alignment is maintained. Vertebrae: Vertebral body heights are preserved. There is no marrow edema. No suspicious osseous lesion. Decreased T1 marrow signal may reflect hematopoietic marrow. Cord: No abnormal signal. Posterior  Fossa, vertebral arteries, paraspinal tissues: There is a retropharyngeal fluid collection extending from approximately C2 to C5 as seen on prior neck CT. Right retropharyngeal adenopathy with largest node measuring 1.8 cm. There is mild mass effect on the posterior pharynx. Nonenlarged and enlarged cervical lymph nodes are also present. No edema or enhancement of the prevertebral muscles. Disc levels: Intervertebral disc heights and signal are maintained. 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 There is no high-grade stenosis. IMPRESSION: No evidence of osteomyelitis or discitis. Retropharyngeal fluid and adenopathy as seen on CT. Electronically Signed   By: Guadlupe SpanishPraneil  Patel M.D.   On: 01/07/2020 14:47    Assessment/Plan Active Problems:   Retropharyngeal abscess   Strep throat with retropharyngeal abscess formation.  He was given one-time dose of Decadron.  I will hold off to steroid with bacterial infection.  It can be given if needed for worsening of swelling. Admit to MedSurg. -Start him on Unasyn. -Tylenol and ibuprofen for pain. -Can use Toradol if needed. -Soft diet. -ENT was consulted from ED-we will appreciate their recommendations regarding draining of retropharyngeal abscess.  Elevated blood pressure.  No prior diagnosis of hypertension. Patient is in pain. -Continue to monitor.  History of asthma.  No acute exacerbation. -Continue home dose of as needed albuterol.  GERD. -Patient was on Prilosec  at home. -Continue with Protonix.  DVT prophylaxis: Lovenox Code Status: Full code Family Communication: Wife was updated at bedside. Disposition Plan: Home  Consults called: ENT  Admission status: Observation   Arnetha Courser MD [facility]  If 7PM-7AM, please contact night-coverage [email or web address]  01/07/2020, 3:56 PM   This record has been created using Conservation officer, historic buildings. Errors have been sought and corrected,but may not always be located.  Such creation errors do not reflect on the standard of care.

## 2020-01-07 NOTE — ED Notes (Signed)
Pt provided sandwich tray as requested. Ok per [place] PA

## 2020-01-07 NOTE — ED Notes (Signed)
Family at bedside  Pt is speaking other family on the phone

## 2020-01-07 NOTE — ED Notes (Signed)
Pt is taken to MRI via stretcher

## 2020-01-07 NOTE — ED Provider Notes (Signed)
[facility] Emergency Department Provider Note  ____________________________________________   First MD Initiated Contact with Patient 01/07/20 1019     (approximate)  I have reviewed the triage vital signs and the nursing notes.   HISTORY  Chief Complaint Neck Pain    HPI Ethan Brewer is a 19 y.o. male presents emergency department complaining of neck pain for the past few days.  States he worked on a car but did not hurt in the neck that day.  The next day had difficulty swallowing.  Now he has a headache and stabbing pain when he swallows.  States pain is a 15 out of 10 when he swallows.  Unsure if he has had fever or chills.  No chest pain or shortness of breath.    Past Medical History:  Diagnosis Date  . Asthma   . GERD (gastroesophageal reflux disease)     Patient Active Problem List   Diagnosis Date Noted  . Subscapular bursitis 12/05/2016  . 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 Abnormal ECG 07/30/2013    Past Surgical History:  Procedure Laterality Date  . TONSILLECTOMY      Prior to Admission medications   Medication Sig Start Date End Date Taking? Authorizing Provider  acetaminophen (TYLENOL) 325 MG tablet Take 650 mg by mouth every 6 (six) hours as needed.    [provider]  albuterol (VENTOLIN HFA) 108 (90 Base) MCG/ACT inhaler Inhale 2 puffs into the lungs every 4 (four) hours as needed for wheezing or shortness of breath. 12/06/18   Withrow, Everardo All, FNP  famotidine (PEPCID) 20 MG tablet Take 20 mg by mouth daily. 12/10/18 01/09/19  [provider]  LORazepam (ATIVAN) 1 MG tablet TAKE 1 TABLET BY MOUTH EVERY 8 HOURS AS NEEDED FOR ANXIETY 09/30/18   [provider]  montelukast (SINGULAIR) 10 MG tablet Take 10 mg by mouth at bedtime. 11/26/18 11/26/19  [provider]  omeprazole (PRILOSEC) 40 MG capsule Take 1 capsule (40 mg total) by mouth daily for 30 days. 12/12/18 01/11/19  Darci Current, MD     Allergies Prednisone  History reviewed. No pertinent family history.  Social History Social History   Tobacco Use  . Smoking status: Never Smoker  . Smokeless tobacco: Never Used  Substance Use Topics  . Alcohol use: Never  . Drug use: Never    Review of Systems  Constitutional: No fever/chills Eyes: No visual changes. ENT: No sore throat.  Positive difficulty with swallowing Respiratory: Denies cough Cardiovascular: Denies chest pain Gastrointestinal: Denies abdominal pain Genitourinary: Negative for dysuria. Musculoskeletal: Negative for back pain.  Positive neck pain Skin: Negative for rash. Psychiatric: no mood changes,     ____________________________________________   PHYSICAL EXAM:  VITAL SIGNS: ED Triage Vitals [01/07/20 1006]  Enc Vitals Group     BP (!) 158/81     Pulse Rate 85     Resp 16     Temp 99.1 F (37.3 C)     Temp Source Oral     SpO2 99 %     Weight 240 lb (108.9 kg)     Height 5\' 9"  (1.753 m)     Head Circumference      Peak Flow      Pain Score 10     Pain Loc      Pain Edu?      Excl. in GC?     Constitutional: Alert and oriented. Well appearing and in no acute distress. Eyes: Conjunctivae  are normal.  Head: Atraumatic. Nose: No congestion/rhinnorhea. Mouth/Throat: Mucous membranes are moist.  Right tonsil is swollen, tonsillar glands are tender to palpation, Neck:  supple no lymphadenopathy noted Cardiovascular: Normal rate, regular rhythm. Heart sounds are normal Respiratory: Normal respiratory effort.  No retractions, lungs c t a  GU: deferred Musculoskeletal: FROM all extremities, warm and well perfused, C-spine and musculature around the neck are nontender, no spasms noted Neurologic:  Normal speech and language.  Skin:  Skin is warm, dry and intact. No rash noted. Psychiatric: Mood and affect are normal. Speech and behavior are normal.  ____________________________________________   LABS (all labs ordered are  listed, but only abnormal results are displayed)  Labs Reviewed  GROUP A STREP BY PCR - Abnormal; Notable for the following components:      Result Value   Group A Strep by PCR DETECTED (*)    All other components within normal limits  CBC WITH DIFFERENTIAL/PLATELET - Abnormal; Notable for the following components:   WBC 13.4 (*)    Neutro Abs 9.6 (*)    Monocytes Absolute 1.1 (*)    All other components within normal limits  BASIC METABOLIC PANEL - Abnormal; Notable for the following components:   Glucose, Bld 105 (*)    All other components within normal limits  SARS CORONAVIRUS 2 BY RT PCR (HOSPITAL ORDER, PERFORMED IN [place] [facility])  MONONUCLEOSIS SCREEN   ____________________________________________   ____________________________________________  RADIOLOGY  CT soft tissue of the neck shows large amount of retropharyngeal fluid collection and lymphadenopathy.  Radiologist did call me to discuss the findings and would like for [place] to order an MRI to rule out osteomyelitis. MRI of the C-spine does not show osteomyelitis but continues to show the fluid collection.  ____________________________________________   PROCEDURES  Procedure(s) performed: No  Procedures    ____________________________________________   INITIAL IMPRESSION / ASSESSMENT AND PLAN / ED COURSE  Pertinent labs & imaging results that were available during my care of the patient were reviewed by me and considered in my medical decision making (see chart for details).   Patient is a 19 year old male presents emergency department with concerns of severe sore throat and difficulty swallowing.  Some neck pain.  See HPI  Physical exam is consistent with a tonsillar infection.  DDx includes strep throat, mono, tonsillar abscess  CBC, metabolic panel, mono, strep test.  CBC is elevated WBC of 13.4, metabolic panel is normal, strep test is positive, mono test is negative  CT soft tissue of  the neck shows retropharyngeal fluid collection along with a large amount of lymphadenopathy radiologist recommends MRI  MRI of the C-spine does not show osteomyelitis but does continue to show the fluid collection.  I discussed this with Dr. Willeen Cass from ENT.  He advises to use Unasyn and Decadron.  Admit with hospitalist.  He will follow the patient to see if he does need a procedure to remove the retropharyngeal fluid.  He hopes that the antibiotics and Decadron will help prevent this procedure.  Hospitalist was paged.  Hospitalist will be admitting the patient for the retropharyngeal fluid collection.  She understands that Dr. Willeen Cass has already been advised and will also see the patient in the hospital.  Patient was notified of all events.  He appears to be stable at this time.   Ethan Brewer was evaluated in Emergency Department on 01/07/2020 for the symptoms described in the history of present illness. He was evaluated in the context of the  global COVID-19 pandemic, which necessitated consideration that the patient might be at risk for infection with the SARS-CoV-2 virus that causes COVID-19. Institutional protocols and algorithms that pertain to the evaluation of patients at risk for COVID-19 are in a state of rapid change based on information released by regulatory bodies including the CDC and federal and state organizations. These policies and algorithms were followed during the patient's care in the ED.   As part of my medical decision making, I reviewed the following data within the electronic MEDICAL RECORD NUMBER History obtained from family, Nursing notes reviewed and incorporated, Labs reviewed , Old chart reviewed, Radiograph reviewed , Discussed with admitting physician , Discussed with radiologist, A consult was requested and obtained from this/these consultant(s) ENT, Notes from prior ED visits and [place] Controlled Substance  Database  ____________________________________________   FINAL CLINICAL IMPRESSION(S) / ED DIAGNOSES  Final diagnoses:  Retropharyngeal abscess  Strep pharyngitis      NEW MEDICATIONS STARTED DURING THIS VISIT:  New Prescriptions   No medications on file     Note:  This document was prepared using Dragon voice recognition software and may include unintentional dictation errors.    Faythe Ghee, PA-C 01/07/20 1526    Minna Antis, MD 01/08/20 1235

## 2020-01-07 NOTE — ED Triage Notes (Signed)
Patient reports pain in neck for the past few days. States he has been working on car and thought the pain came from that. Patient states now he is having a headache and pain with swallowing.

## 2020-01-07 NOTE — ED Notes (Signed)
Resting at present  Awaiting room assignment  Family at bedside

## 2020-01-07 NOTE — ED Notes (Signed)
Admitting MD in with pt    

## 2020-01-07 NOTE — Consult Note (Signed)
Ethan Brewer, Ethan Brewer [id number] [birth year] Sandi Mealy, MD  Reason for Consult: retropharyngeal abscess Requesting Physician: Arnetha Courser, MD Consulting Physician: Sandi Mealy  HPI: This 19 y.o. year old male was admitted on 01/07/2020 for Retropharyngeal abscess [J39.0]. Has 3 day history of worsening sore throat and increasing right sided ear pain. Denied significant trouble swallowing and no difficulty breathing. CT showed possible fluid collection versus early retropharyngeal abscess as well as enlarged retropharyngeal and cervical nodes. MRI was done to rule out associated osteomyelitis of the C-spine, but was fortunately negative. Since here he has had Unasyn and decadron, and notes the ear pain is much better, throat improved, and also can now turn his head easier, as he did have some decreased ROM of the neck when he came here.   Allergies:  Allergies  Allergen Reactions  . Prednisone Palpitations    Rapid heart rate    Medications: (Not in a hospital admission) .  Current Facility-Administered Medications  Medication Dose Route Frequency Provider Last Rate Last Admin  . albuterol (VENTOLIN HFA) 108 (90 Base) MCG/ACT inhaler 2 puff  2 puff Inhalation Q4H PRN Arnetha Courser, MD      . Ampicillin-Sulbactam (UNASYN) 3 g in sodium chloride 0.9 % 100 mL IVPB  3 g Intravenous Q6H Patel, Kishan S, RPH      . enoxaparin (LOVENOX) injection 40 mg  40 mg Subcutaneous Q24H Amin, Tilman Neat, MD      . ibuprofen (ADVIL) tablet 400 mg  400 mg Oral Q6H PRN Arnetha Courser, MD      . ketorolac (TORADOL) 30 MG/ML injection 30 mg  30 mg Intravenous Q6H PRN Arnetha Courser, MD      . pantoprazole (PROTONIX) EC tablet 40 mg  40 mg Oral Daily Arnetha Courser, MD   40 mg at 01/07/20 1644   Current Outpatient Medications  Medication Sig Dispense Refill  . alum & mag hydroxide-simeth (MAALOX/MYLANTA) 200-200-20 MG/5ML suspension Take 15-30 mLs by mouth every 6 (six) hours as needed for indigestion or  heartburn.    . fluticasone (FLONASE) 50 MCG/ACT nasal spray Place 1-2 sprays into both nostrils daily as needed for allergies or rhinitis.    Marland Kitchen loratadine (CLARITIN) 10 MG tablet Take 10 mg by mouth daily as needed for allergies.    Marland Kitchen omeprazole (PRILOSEC OTC) 20 MG tablet Take 20 mg by mouth daily as needed (acid reflux symptoms).      PMH:  Past Medical History:  Diagnosis Date  . Asthma   . GERD (gastroesophageal reflux disease)     Fam Hx: History reviewed. No pertinent family history.  Soc Hx:  Social History   Socioeconomic History  . Marital status: Married    Spouse name: Not on file  . Number of children: Not on file  . Years of education: Not on file  . Highest education level: Not on file  Occupational History  . Not on file  Tobacco Use  . Smoking status: Never Smoker  . Smokeless tobacco: Never Used  Substance and Sexual Activity  . Alcohol use: Never  . Drug use: Never  . Sexual activity: Not on file  Other Topics Concern  . Not on file  Social History Narrative  . Not on file   Social Determinants of Health   Financial Resource Strain:   . Difficulty of Paying Living Expenses:   Food Insecurity:   . Worried About Programme researcher, broadcasting/film/video in the Last Year:   . Barista in  the Last Year:   Transportation Needs:   . Freight forwarder (Medical):   Marland Kitchen Lack of Transportation (Non-Medical):   Physical Activity:   . Days of Exercise per Week:   . Minutes of Exercise per Session:   Stress:   . Feeling of Stress :   Social Connections:   . Frequency of Communication with Friends and Family:   . Frequency of Social Gatherings with Friends and Family:   . Attends Religious Services:   . Active Member of Clubs or Organizations:   . Attends Banker Meetings:   Marland Kitchen Marital Status:   Intimate Partner Violence:   . Fear of Current or Ex-Partner:   . Emotionally Abused:   Marland Kitchen Physically Abused:   . Sexually Abused:     PSH:  Past  Surgical History:  Procedure Laterality Date  . TONSILLECTOMY    . Procedures since admission: No admission procedures for hospital encounter.  ROS: Review of systems normal other than 12 systems except per HPI.  PHYSICAL EXAM Vitals:  Vitals:   01/07/20 1611 01/07/20 1646  BP: 132/64 (!) 130/57  Pulse: 98 92  Resp: 18 16  Temp:  98.7 F (37.1 C)  SpO2: 98% 98%  . General: Well-developed, Well-nourished in no acute distress Mood: Mood and affect well adjusted, pleasant and cooperative. Orientation: Grossly alert and oriented. Vocal Quality: No hoarseness. Communicates verbally. No stridor. head and Face: NCAT. No facial asymmetry. No visible skin lesions. No significant facial scars. No tenderness with sinus percussion. Facial strength normal and symmetric. Ears: External ears with normal landmarks, no lesions. External auditory canals free of infection, cerumen impaction or lesions. Tympanic membranes intact with good landmarks and normal mobility on pneumatic otoscopy. No middle ear effusion. Hearing: Speech reception grossly normal. Nose: External nose normal with midline dorsum and no lesions or deformity. Nasal Cavity reveals essentially midline septum with normal inferior turbinates. No significant mucosal congestion or erythema. Nasal secretions are minimal and clear. No polyps seen on anterior rhinoscopy. Oral Cavity/ Oropharynx: Lips are normal with no lesions. Teeth no frank dental caries. Gingiva healthy with no lesions or gingivitis. Oropharynx including tongue, buccal mucosa, floor of mouth, hard and soft palate, uvula and posterior pharynx free of exudates. There is mild erythema and fullness in the right side of the pharynx. The tonsils have been removed. Indirect Laryngoscopy/Nasopharyngoscopy: Visualization of the larynx, hypopharynx and nasopharynx is not possible in this setting with routine examination. Neck: Supple and symmetric with no palpable masses, tenderness or  crepitance. The trachea is midline. Thyroid gland is soft, nontender and symmetric with no masses or enlargement. Parotid and submandibular glands are soft, nontender and symmetric, without masses. Lymphatic: Cervical lymph nodes reveal mildly tender jugulodigastric adenopathy. Respiratory: Normal respiratory effort without labored breathing. Cardiovascular: Carotid pulse shows regular rate and rhythm Neurologic: Cranial Nerves II through XII are grossly intact. Eyes: Gaze and Ocular Motility are grossly normal. PERRLA. No visible nystagmus.  MEDICAL DECISION MAKING: Data Review:  Results for orders placed or performed during the hospital encounter of 01/07/20 (from the past 48 hour(s))  CBC with Differential     Status: Abnormal   Collection Time: 01/07/20 10:49 AM  Result Value Ref Range   WBC 13.4 (H) 4.0 - 10.5 K/uL   RBC 5.04 4.22 - 5.81 MIL/uL   Hemoglobin 13.8 13.0 - 17.0 g/dL   HCT 37.1 39 - 52 %   MCV 81.5 80.0 - 100.0 fL   MCH 27.4 26.0 - 34.0  pg   MCHC 33.6 30.0 - 36.0 g/dL   RDW 32.4 40.1 - 02.7 %   Platelets 250 150 - 400 K/uL   nRBC 0.0 0.0 - 0.2 %   Neutrophils Relative % 73 %   Neutro Abs 9.6 (H) 1.7 - 7.7 K/uL   Lymphocytes Relative 19 %   Lymphs Abs 2.6 0.7 - 4.0 K/uL   Monocytes Relative 8 %   Monocytes Absolute 1.1 (H) 0 - 1 K/uL   Eosinophils Relative 0 %   Eosinophils Absolute 0.0 0 - 0 K/uL   Basophils Relative 0 %   Basophils Absolute 0.0 0 - 0 K/uL   Immature Granulocytes 0 %   Abs Immature Granulocytes 0.04 0.00 - 0.07 K/uL    Comment: Performed at [facility], [address], [place], [place] [address]  Basic metabolic panel     Status: Abnormal   Collection Time: 01/07/20 10:49 AM  Result Value Ref Range   Sodium 138 135 - 145 mmol/L   Potassium 3.8 3.5 - 5.1 mmol/L   Chloride 105 98 - 111 mmol/L   CO2 27 22 - 32 mmol/L   Glucose, Bld 105 (H) 70 - 99 mg/dL    Comment: Glucose reference range applies only to samples taken after fasting  for at least 8 hours.   BUN 13 6 - 20 mg/dL   Creatinine, Ser 4.40 0.61 - 1.24 mg/dL   Calcium 9.3 8.9 - 34.7 mg/dL   GFR calc non Af Amer >60 >60 mL/min   GFR calc Af Amer >60 >60 mL/min   Anion gap 6 5 - 15    Comment: Performed at [facility], [address] [place], [place] [address]  Group A Strep by PCR [facility] Only)     Status: Abnormal   Collection Time: 01/07/20 10:49 AM   Specimen: Throat; Sterile Swab  Result Value Ref Range   Group A Strep by PCR DETECTED (A) NOT DETECTED    Comment: Performed at [facility], [address], [place], [place] [address]  Mononucleosis screen     Status: None   Collection Time: 01/07/20 10:49 AM  Result Value Ref Range   Mono Screen NEGATIVE NEGATIVE    Comment: Performed at [facility], [address] [place], [place] [address]  SARS Coronavirus 2 by RT PCR (hospital order, performed in [facility] hospital lab) Nasopharyngeal Nasopharyngeal Swab     Status: None   Collection Time: 01/07/20  2:35 PM   Specimen: Nasopharyngeal Swab  Result Value Ref Range   SARS Coronavirus 2 NEGATIVE NEGATIVE    Comment: (NOTE) SARS-CoV-2 target nucleic acids are NOT DETECTED.  The SARS-CoV-2 RNA is generally detectable in upper and lower respiratory specimens during the acute phase of infection. The lowest concentration of SARS-CoV-2 viral copies this assay can detect is 250 copies / mL. A negative result does not preclude SARS-CoV-2 infection and should not be used as the sole basis for treatment or other patient management decisions.  A negative result may occur with improper specimen collection / handling, submission of specimen other than nasopharyngeal swab, presence of viral mutation(s) within the areas targeted by this assay, and inadequate number of viral copies (<250 copies / mL). A negative result must be combined with clinical observations, patient history, and epidemiological information.  Fact Sheet for  Patients:   BoilerBrush.com.cy  Fact Sheet for Healthcare Providers: https://pope.com/  This test is not yet approved or  cleared by the [place] FDA and has been authorized for detection and/or diagnosis of SARS-CoV-2 by FDA under an Emergency Use Authorization (EUA).  This EUA will remain in effect (meaning this test can be used) for the duration of the COVID-19 declaration under Section 564(b)(1) of the Act, 21 U.S.C. section 360bbb-3(b)(1), unless the authorization is terminated or revoked sooner.  Performed at [facility], [address] [place], [place] [id number]   . CT Soft Tissue Neck W Contrast  Result Date: 01/07/2020 CLINICAL DATA:  Epiglottitis or tonsillitis suspected. Additional history provided: Patient reports right-sided neck pain and trouble swallowing with low grade fever. EXAM: CT NECK WITH CONTRAST TECHNIQUE: Multidetector CT imaging of the neck was performed using the standard protocol following the bolus administration of intravenous contrast. CONTRAST:  75mL OMNIPAQUE IOHEXOL 300 MG/ML  SOLN COMPARISON:  No pertinent prior studies available for comparison. FINDINGS: Pharynx and larynx: There is no definite tonsillar swelling or discrete mass. There are retropharyngeal inflammatory changes. Prominent right retropharyngeal lymphadenopathy with nodes measuring up 1.8 cm in short axis (series 2, image 38) (series 5, image 55). There is a retropharyngeal fluid collection spanning the C2-C5 levels which measures 1.6 x 2.4 x 6.6 cm (AP x TV x CC) (for instance as seen on series 2, image 56) (series 5, image 65). This collection demonstrates subtle peripheral enhancement. The retropharyngeal lymph nodes result in mild effacement of the oropharyngeal airway. Salivary glands: No inflammation, mass, or stone. Thyroid: Unremarkable Lymph nodes: Please see discussion of retropharyngeal lymphadenopathy above. There is also  bilateral upper cervical chain lymphadenopathy. Most notably a left level II lymph node measures 1.8 cm in short axis (series 5, image 85). Vascular: The major vascular structures of the neck are patent. Limited intracranial: No acute intracranial abnormality identified. Visualized orbits: Unremarkable. Mastoids and visualized paranasal sinuses: Scattered trace paranasal sinus mucosal thickening at the imaged levels. No significant mastoid effusion. Skeleton: No appreciable acute bony abnormality or aggressive osseous lesion. Upper chest: No consolidation within the imaged lung apices. These results were called by telephone at the time of interpretation on 01/07/2020 at 12:10 pm to provider Greig RightSUSAN FISHER , who verbally acknowledged these results. IMPRESSION: Findings consistent with retropharyngeal infection. There is a 1.6 x 2.4 x 6.6 cm retropharyngeal fluid collection spanning the C2-C5 levels. This collection demonstrates thin peripheral enhancement and may reflect an effusion or early abscess. Additionally, there is prominent right retropharyngeal lymphadenopathy with resultant mild oropharyngeal airway effacement. This retropharyngeal infection may be secondary to pharyngitis. Early cervical spine discitis/osteomyelitis may be occult by CT and contrast-enhanced cervical spine MRI is recommended for further evaluation. Bilateral upper cervical lymphadenopathy, likely reactive. Scattered trace paranasal sinus mucosal thickening. Electronically Signed   By: Jackey LogeKyle  Golden DO   On: 01/07/2020 12:10   MR CERVICAL SPINE W WO CONTRAST  Result Date: 01/07/2020 CLINICAL DATA:  Retropharyngeal fluid collection EXAM: MRI CERVICAL SPINE WITHOUT AND WITH CONTRAST TECHNIQUE: Multiplanar and multiecho pulse sequences of the cervical spine, to include the craniocervical junction and cervicothoracic junction, were obtained without and with intravenous contrast. CONTRAST:  10mL GADAVIST GADOBUTROL 1 MMOL/ML IV SOLN COMPARISON:   Correlation made with prior neck CT FINDINGS: Alignment: Anteroposterior alignment is maintained. Vertebrae: Vertebral body heights are preserved. There is no marrow edema. No suspicious osseous lesion. Decreased T1 marrow signal may reflect hematopoietic marrow. Cord: No abnormal signal. Posterior Fossa, vertebral arteries, paraspinal tissues: There is a retropharyngeal fluid collection extending from approximately C2 to C5 as seen on prior neck CT. Right  retropharyngeal adenopathy with largest node measuring 1.8 cm. There is mild mass effect on the posterior pharynx. Nonenlarged and enlarged cervical lymph nodes are also present. No edema or enhancement of the prevertebral muscles. Disc levels: Intervertebral disc heights and signal are maintained. 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 There is no high-grade stenosis. IMPRESSION: No evidence of osteomyelitis or discitis. Retropharyngeal fluid and adenopathy as seen on CT. Electronically Signed   By: Guadlupe Spanish M.D.   On: 01/07/2020 14:47  .   PROCEDURE: Procedure: Diagnostic Fiberoptic Nasolaryngoscopy Diagnosis: Pharyngitis, retropharyngeal fluid collection Indications: Evaluate airway Findings:Nasopharynx shows some adenoid hyperplasia. There is fullness in the right oropharynx that corresponds to the larger retropharyngeal node on the scan. No significant mass effect noted on the airway from the lower posterior wall of the upper hypopharynx where the largest area of fluid collection was noted on the scan. Airway patent. TVC clear and mobile. Description of Procedure: After discussing procedure and risks  (primarily nose bleed) with the patient, the nose was anesthetized with topical Lidocaine 4% and decongested with phenylephrine. A flexible fiberoptic scope was passed through the nasal cavity. The nasal cavity was inspected and the scope passed through the Nasopharynx to the region of the hypopharynx and larynx. The patient was  instructed to phonate to assess vocal cord mobility. The tongue was extended to evaluate the tongue base completely. Valsalva was performed to insufflate the hypopharynx for improved examination. Findings are as noted above. The scope was withdrawn. The patient tolerated the procedure well.  ASSESSMENT: Acute pharyngitis with retropharyngeal fluid collection  PLAN: He is already showing improvement with medical management. Recommend continuing IV Unasyn 3 grams every 6 hours and would also continue Decadron IV, which will help reduce the lymph node inflammation and associated edema. Will follow, but currently would not recommend surgical drainage, as this may clear with medical management.    Sandi Mealy, MD 01/07/2020 5:20 PM

## 2020-01-07 NOTE — ED Notes (Signed)
This RN called to give report to Bobby Rumpf, was told RN will call back with questions, informed pt on the way

## 2020-01-07 NOTE — ED Notes (Signed)
See triage note  Presents with pain to neck  States started couple of days ago   Pain became worse Low grade temp on arrival States today pain is moving into ear

## 2020-01-08 DIAGNOSIS — Z888 Allergy status to other drugs, medicaments and biological substances status: Secondary | ICD-10-CM | POA: Diagnosis not present

## 2020-01-08 DIAGNOSIS — J390 Retropharyngeal and parapharyngeal abscess: Secondary | ICD-10-CM | POA: Diagnosis present

## 2020-01-08 DIAGNOSIS — Z7901 Long term (current) use of anticoagulants: Secondary | ICD-10-CM | POA: Diagnosis not present

## 2020-01-08 DIAGNOSIS — J45909 Unspecified asthma, uncomplicated: Secondary | ICD-10-CM | POA: Diagnosis not present

## 2020-01-08 DIAGNOSIS — Z79899 Other long term (current) drug therapy: Secondary | ICD-10-CM | POA: Diagnosis not present

## 2020-01-08 DIAGNOSIS — Z20822 Contact with and (suspected) exposure to covid-19: Secondary | ICD-10-CM | POA: Diagnosis not present

## 2020-01-08 DIAGNOSIS — J020 Streptococcal pharyngitis: Secondary | ICD-10-CM | POA: Diagnosis present

## 2020-01-08 DIAGNOSIS — R030 Elevated blood-pressure reading, without diagnosis of hypertension: Secondary | ICD-10-CM | POA: Diagnosis not present

## 2020-01-08 DIAGNOSIS — Z7952 Long term (current) use of systemic steroids: Secondary | ICD-10-CM | POA: Diagnosis not present

## 2020-01-08 DIAGNOSIS — K219 Gastro-esophageal reflux disease without esophagitis: Secondary | ICD-10-CM | POA: Diagnosis not present

## 2020-01-08 DIAGNOSIS — D72829 Elevated white blood cell count, unspecified: Secondary | ICD-10-CM | POA: Diagnosis not present

## 2020-01-08 LAB — CBC
HCT: 42.6 % (ref 39.0–52.0)
Hemoglobin: 14.3 g/dL (ref 13.0–17.0)
MCH: 27.6 pg (ref 26.0–34.0)
MCHC: 33.6 g/dL (ref 30.0–36.0)
MCV: 82.1 fL (ref 80.0–100.0)
Platelets: 288 10*3/uL (ref 150–400)
RBC: 5.19 MIL/uL (ref 4.22–5.81)
RDW: 13.8 % (ref 11.5–15.5)
WBC: 20.6 10*3/uL — ABNORMAL HIGH (ref 4.0–10.5)
nRBC: 0 % (ref 0.0–0.2)

## 2020-01-08 MED ORDER — METHYLPREDNISOLONE 4 MG PO TBPK
ORAL_TABLET | ORAL | 0 refills | Status: DC
Start: 2020-01-08 — End: 2020-06-30

## 2020-01-08 MED ORDER — AMOXICILLIN-POT CLAVULANATE 875-125 MG PO TABS
1.0000 | ORAL_TABLET | Freq: Two times a day (BID) | ORAL | 0 refills | Status: AC
Start: 2020-01-08 — End: 2020-01-17

## 2020-01-08 NOTE — Discharge Instructions (Signed)
Soft diet recommended

## 2020-01-08 NOTE — Progress Notes (Signed)
Discharge order received. Patient mental status is at baseline. Vital signs stable . No signs of acute distress. Discharge instructions given. Patient verbalized understanding. No other issues noted at this time.   

## 2020-01-08 NOTE — Discharge Summary (Signed)
[facility]   PATIENT NAME: Ethan Brewer    MR#:  [id number]  DATE OF BIRTH:  2001-01-04  DATE OF ADMISSION:  01/07/2020 ADMITTING PHYSICIAN: Arnetha CourserSumayya Amin, MD  DATE OF DISCHARGE: 01/08/2020  PRIMARY CARE PHYSICIAN: [facility]    ADMISSION DIAGNOSIS:  Retropharyngeal abscess [J39.0] Strep pharyngitis [J02.0]  DISCHARGE DIAGNOSIS:  Acute streptococcal pharyngitis  SECONDARY DIAGNOSIS:   Past Medical History:  Diagnosis Date  . Asthma   . GERD (gastroesophageal reflux disease)     HOSPITAL COURSE:  Ethan Brewer is a 19 y.o. male with medical history significant of GERD and asthma came to ED with complaint of sore throat and neck pain for the past few days.Having some difficulty with swallowing due to pain.  Strep throat with retropharyngeal fluid collection  -- on Unasyn-- switch to oral Augmentin -patient was on IV Decadron. Change to Medrol Dosepak -Tylenol and ibuprofen for pain. -Can use Toradol if needed. -Soft diet. -ENT consultation with Dr. Willeen CassBennett appreciated. No drain able collection. Patient is feeling better overall. Dr. Willeen CassBennett recommends change to oral Augmentin and Decadron have follow-up with him in 10 days -patient overall feels a lot better. No fever.  Leukocytosis mild suspected reactive from steroids and pharyngitis  Elevated blood pressure.  No prior diagnosis of hypertension. Patient is in pain. -Continue to monitor.  History of asthma.  No acute exacerbation. -Continue home dose of as needed albuterol.  GERD. -Patient was on Prilosec at home. -Continue with Protonix.  DVT prophylaxis: Lovenox Code Status: Full code Family Communication:  mother was updated on the phone Disposition Plan: Home  Consults called: ENT  Admission status: Observation  Patient will discharged later today after finishing his dose of unison at 6 PM. This was discussed with patient's mother. Agreeable  with the plan. Patient is agreeable with the plan as well. ENT aware.  CONSULTS OBTAINED:    DRUG ALLERGIES:   Allergies  Allergen Reactions  . Prednisone Palpitations    Rapid heart rate    DISCHARGE MEDICATIONS:   Allergies as of 01/08/2020      Reactions   Prednisone Palpitations   Rapid heart rate      Medication List    TAKE these medications   alum & mag hydroxide-simeth 200-200-20 MG/5ML suspension Commonly known as: MAALOX/MYLANTA Take 15-30 mLs by mouth every 6 (six) hours as needed for indigestion or heartburn.   amoxicillin-clavulanate 875-125 MG tablet Commonly known as: Augmentin Take 1 tablet by mouth 2 (two) times daily for 9 days.   fluticasone 50 MCG/ACT nasal spray Commonly known as: FLONASE Place 1-2 sprays into both nostrils daily as needed for allergies or rhinitis.   loratadine 10 MG tablet Commonly known as: CLARITIN Take 10 mg by mouth daily as needed for allergies.   methylPREDNISolone 4 MG Tbpk tablet Commonly known as: MEDROL DOSEPAK Take as directed on inside of package   omeprazole 20 MG tablet Commonly known as: PRILOSEC OTC Take 20 mg by mouth daily as needed (acid reflux symptoms).       If you experience worsening of your admission symptoms, develop shortness of breath, life threatening emergency, suicidal or homicidal thoughts you must seek medical attention immediately by calling 911 or calling your MD immediately  if symptoms less severe.  You Must read complete instructions/literature along with all the possible adverse reactions/side effects for all the Medicines you take and that have been prescribed to you. Take any new Medicines after you have  completely understood and accept all the possible adverse reactions/side effects.   Please note  You were cared for by a hospitalist during your hospital stay. If you have any questions about your discharge medications or the care you received while you were in the hospital after  you are discharged, you can call the unit and asked to speak with the hospitalist on call if the hospitalist that took care of you is not available. Once you are discharged, your primary care physician will handle any further medical issues. Please note that NO REFILLS for any discharge medications will be authorized once you are discharged, as it is imperative that you return to your primary care physician (or establish a relationship with a primary care physician if you do not have one) for your aftercare needs so that they can reassess your need for medications and monitor your lab values. Today   SUBJECTIVE   I feel a lot better. Able to swallow soft diet.  No cough  VITAL SIGNS:  Blood pressure (!) 155/67, pulse 93, temperature 98.2 F (36.8 C), temperature source Oral, resp. rate 16, height 5\' 9"  (1.753 m), weight 108.9 kg, SpO2 97 %.  I/O:    Intake/Output Summary (Last 24 hours) at 01/08/2020 1405 Last data filed at 01/08/2020 1340 Gross per 24 hour  Intake 828.92 ml  Output --  Net 828.92 ml    PHYSICAL EXAMINATION:  GENERAL:  19 y.o.-year-old patient lying in the bed with no acute distress. obese EYES: Pupils equal, round, reactive to light and accommodation. No scleral icterus.  HEENT: Head atraumatic, normocephalic. Oropharynx and nasopharynx clear.  Mild phayrngeal erythema--no pus NECK:  Supple, no jugular venous distention. No thyroid enlargement, no tenderness.  LUNGS: Normal breath sounds bilaterally, no wheezing, rales,rhonchi or crepitation. No use of accessory muscles of respiration.  CARDIOVASCULAR: S1, S2 normal. No murmurs, rubs, or gallops.  ABDOMEN: Soft, non-tender, non-distended. Bowel sounds present. No organomegaly or mass.  EXTREMITIES: No pedal edema, cyanosis, or clubbing.  NEUROLOGIC: Cranial nerves II through XII are intact. Muscle strength 5/5 in all extremities. Sensation intact. Gait not checked.  PSYCHIATRIC: The patient is alert and oriented x 3.   SKIN: No obvious rash, lesion, or ulcer.   DATA REVIEW:   CBC  Recent Labs  Lab 01/08/20 0425  WBC 20.6*  HGB 14.3  HCT 42.6  PLT 288    Chemistries  Recent Labs  Lab 01/07/20 1049  NA 138  K 3.8  CL 105  CO2 27  GLUCOSE 105*  BUN 13  CREATININE 0.94  CALCIUM 9.3    Microbiology Results   Recent Results (from the past 240 hour(s))  Group A Strep by PCR ([facility] Only)     Status: Abnormal   Collection Time: 01/07/20 10:49 AM   Specimen: Throat; Sterile Swab  Result Value Ref Range Status   Group A Strep by PCR DETECTED (A) NOT DETECTED Final    Comment: Performed at [facility], [address] [place], [place]  SARS Coronavirus 2 by RT PCR (hospital order, performed in [facility] hospital lab) Nasopharyngeal Nasopharyngeal Swab     Status: None   Collection Time: 01/07/20  2:35 PM   Specimen: Nasopharyngeal Swab  Result Value Ref Range Status   SARS Coronavirus 2 NEGATIVE NEGATIVE Final    Comment: (NOTE) SARS-CoV-2 target nucleic acids are NOT DETECTED.  The SARS-CoV-2 RNA is generally detectable in upper and lower respiratory specimens during the acute phase of infection. The  lowest concentration of SARS-CoV-2 viral copies this assay can detect is 250 copies / mL. A negative result does not preclude SARS-CoV-2 infection and should not be used as the sole basis for treatment or other patient management decisions.  A negative result may occur with improper specimen collection / handling, submission of specimen other than nasopharyngeal swab, presence of viral mutation(s) within the areas targeted by this assay, and inadequate number of viral copies (<250 copies / mL). A negative result must be combined with clinical observations, patient history, and epidemiological information.  Fact Sheet for Patients:   BoilerBrush.com.cy  Fact Sheet for Healthcare Providers: https://pope.com/  This  test is not yet approved or  cleared by the [place] FDA and has been authorized for detection and/or diagnosis of SARS-CoV-2 by FDA under an Emergency Use Authorization (EUA).  This EUA will remain in effect (meaning this test can be used) for the duration of the COVID-19 declaration under Section 564(b)(1) of the Act, 21 U.S.C. section 360bbb-3(b)(1), unless the authorization is terminated or revoked sooner.  Performed at [facility], [address] [place], [place] [address]     RADIOLOGY:  CT Soft Tissue Neck W Contrast  Result Date: 01/07/2020 CLINICAL DATA:  Epiglottitis or tonsillitis suspected. Additional history provided: Patient reports right-sided neck pain and trouble swallowing with low grade fever. EXAM: CT NECK WITH CONTRAST TECHNIQUE: Multidetector CT imaging of the neck was performed using the standard protocol following the bolus administration of intravenous contrast. CONTRAST:  75mL OMNIPAQUE IOHEXOL 300 MG/ML  SOLN COMPARISON:  No pertinent prior studies available for comparison. FINDINGS: Pharynx and larynx: There is no definite tonsillar swelling or discrete mass. There are retropharyngeal inflammatory changes. Prominent right retropharyngeal lymphadenopathy with nodes measuring up 1.8 cm in short axis (series 2, image 38) (series 5, image 55). There is a retropharyngeal fluid collection spanning the C2-C5 levels which measures 1.6 x 2.4 x 6.6 cm (AP x TV x CC) (for instance as seen on series 2, image 56) (series 5, image 65). This collection demonstrates subtle peripheral enhancement. The retropharyngeal lymph nodes result in mild effacement of the oropharyngeal airway. Salivary glands: No inflammation, mass, or stone. Thyroid: Unremarkable Lymph nodes: Please see discussion of retropharyngeal lymphadenopathy above. There is also bilateral upper cervical chain lymphadenopathy. Most notably a left level II lymph node measures 1.8 cm in short axis (series 5,  image 85). Vascular: The major vascular structures of the neck are patent. Limited intracranial: No acute intracranial abnormality identified. Visualized orbits: Unremarkable. Mastoids and visualized paranasal sinuses: Scattered trace paranasal sinus mucosal thickening at the imaged levels. No significant mastoid effusion. Skeleton: No appreciable acute bony abnormality or aggressive osseous lesion. Upper chest: No consolidation within the imaged lung apices. These results were called by telephone at the time of interpretation on 01/07/2020 at 12:10 pm to provider Greig Right , who verbally acknowledged these results. IMPRESSION: Findings consistent with retropharyngeal infection. There is a 1.6 x 2.4 x 6.6 cm retropharyngeal fluid collection spanning the C2-C5 levels. This collection demonstrates thin peripheral enhancement and may reflect an effusion or early abscess. Additionally, there is prominent right retropharyngeal lymphadenopathy with resultant mild oropharyngeal airway effacement. This retropharyngeal infection may be secondary to pharyngitis. Early cervical spine discitis/osteomyelitis may be occult by CT and contrast-enhanced cervical spine MRI is recommended for further evaluation. Bilateral upper cervical lymphadenopathy, likely reactive. Scattered trace paranasal sinus mucosal thickening. Electronically Signed   By: Jackey Loge DO   On: 01/07/2020 12:10  MR CERVICAL SPINE W WO CONTRAST  Result Date: 01/07/2020 CLINICAL DATA:  Retropharyngeal fluid collection EXAM: MRI CERVICAL SPINE WITHOUT AND WITH CONTRAST TECHNIQUE: Multiplanar and multiecho pulse sequences of the cervical spine, to include the craniocervical junction and cervicothoracic junction, were obtained without and with intravenous contrast. CONTRAST:  72mL GADAVIST GADOBUTROL 1 MMOL/ML IV SOLN COMPARISON:  Correlation made with prior neck CT FINDINGS: Alignment: Anteroposterior alignment is maintained. Vertebrae: Vertebral body  heights are preserved. There is no marrow edema. No suspicious osseous lesion. Decreased T1 marrow signal may reflect hematopoietic marrow. Cord: No abnormal signal. Posterior Fossa, vertebral arteries, paraspinal tissues: There is a retropharyngeal fluid collection extending from approximately C2 to C5 as seen on prior neck CT. Right retropharyngeal adenopathy with largest node measuring 1.8 cm. There is mild mass effect on the posterior pharynx. Nonenlarged and enlarged cervical lymph nodes are also present. No edema or enhancement of the prevertebral muscles. Disc levels: Intervertebral disc heights and signal are maintained. 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 There is no high-grade stenosis. IMPRESSION: No evidence of osteomyelitis or discitis. Retropharyngeal fluid and adenopathy as seen on CT. Electronically Signed   By: Guadlupe Spanish M.D.   On: 01/07/2020 14:47     CODE STATUS:     Code Status Orders  (From admission, onward)         Start     Ordered   01/07/20 1554  Full code  Continuous        01/07/20 1554        Code Status History    This patient has a current code status but no historical code status.   Advance Care Planning Activity       TOTAL TIME TAKING CARE OF THIS PATIENT: 40* minutes.    Enedina Finner M.D  [facility]    CC: Primary care physician; Nira Retort

## 2020-01-26 ENCOUNTER — Emergency Department
Admission: EM | Admit: 2020-01-26 | Discharge: 2020-01-27 | Disposition: A | Discharge status: Home / Self Care | Payer: Medicaid Other | Attending: Emergency Medicine | Admitting: Emergency Medicine

## 2020-01-26 ENCOUNTER — Encounter: Payer: Self-pay | Admitting: Emergency Medicine

## 2020-01-26 ENCOUNTER — Other Ambulatory Visit: Payer: Self-pay

## 2020-01-26 DIAGNOSIS — H9201 Otalgia, right ear: Secondary | ICD-10-CM

## 2020-01-26 DIAGNOSIS — J45909 Unspecified asthma, uncomplicated: Secondary | ICD-10-CM | POA: Insufficient documentation

## 2020-01-26 DIAGNOSIS — Z79899 Other long term (current) drug therapy: Secondary | ICD-10-CM | POA: Diagnosis not present

## 2020-01-26 NOTE — ED Triage Notes (Signed)
Pt c/o right ear pain x3 days. Denies discharge and fever.

## 2020-01-27 MED ORDER — IBUPROFEN 400 MG PO TABS
600.0000 mg | ORAL_TABLET | Freq: Once | ORAL | Status: AC
  Administered 2020-01-27: 600 mg via ORAL
  Filled 2020-01-27: qty 2

## 2020-01-27 NOTE — ED Provider Notes (Signed)
[facility] Emergency Department Provider Note  ____________________________________________  Time seen: Approximately 12:07 AM  I have reviewed the triage vital signs and the nursing notes.   HISTORY  Chief Complaint Otalgia   HPI Ethan Brewer is a 19 y.o. male who presents for evaluation of right ear pain.  Patient was admitted to the hospital a month ago with a retropharyngeal abscess.  No drainage was indicated.  He was treated with steroids and antibiotics.  He reports that he finished his antibiotics about a week ago.  He reports that the sore throat, neck pain, and difficulty swallowing have resolved.  He has had a persistent right ear pain which is also improving.  He describes this is now intermittent sharp pain on the right ear.  When he has the pain he feels that his hearing is impaired.  No recent swimming, no fever.    Past Medical History:  Diagnosis Date  . Asthma   . GERD (gastroesophageal reflux disease)     Patient Active Problem List   Diagnosis Date Noted  . Retropharyngeal abscess 01/07/2020  . Strep pharyngitis   . Subscapular bursitis 12/05/2016  . 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 Abnormal ECG 07/30/2013    Past Surgical History:  Procedure Laterality Date  . TONSILLECTOMY      Prior to Admission medications   Medication Sig Start Date End Date Taking? Authorizing Provider  alum & mag hydroxide-simeth (MAALOX/MYLANTA) 200-200-20 MG/5ML suspension Take 15-30 mLs by mouth every 6 (six) hours as needed for indigestion or heartburn.    [provider]  fluticasone (FLONASE) 50 MCG/ACT nasal spray Place 1-2 sprays into both nostrils daily as needed for allergies or rhinitis.    [provider]  loratadine (CLARITIN) 10 MG tablet Take 10 mg by mouth daily as needed for allergies.    [provider]  methylPREDNISolone (MEDROL DOSEPAK) 4 MG TBPK tablet Take as directed on inside of package 01/08/20    Enedina Finner, MD  omeprazole (PRILOSEC OTC) 20 MG tablet Take 20 mg by mouth daily as needed (acid reflux symptoms).    [provider]    Allergies Prednisone  History reviewed. No pertinent family history.  Social History Social History   Tobacco Use  . Smoking status: Never Smoker  . Smokeless tobacco: Never Used  Substance Use Topics  . Alcohol use: Never  . Drug use: Never    Review of Systems  Constitutional: Negative for fever. Eyes: Negative for visual changes. ENT: Negative for sore throat. + R ear pain Neck: No neck pain  Cardiovascular: Negative for chest pain. Respiratory: Negative for shortness of breath. Gastrointestinal: Negative for abdominal pain, vomiting or diarrhea. Genitourinary: Negative for dysuria. Musculoskeletal: Negative for back pain. Skin: Negative for rash. Neurological: Negative for headaches, weakness or numbness. Psych: No SI or HI  ____________________________________________   PHYSICAL EXAM:  VITAL SIGNS: ED Triage Vitals [01/26/20 2035]  Enc Vitals Group     BP (!) 179/83     Pulse Rate 60     Resp 18     Temp 99.3 F (37.4 C)     Temp Source Oral     SpO2 98 %     Weight      Height      Head Circumference      Peak Flow      Pain Score      Pain Loc      Pain Edu?      Excl.  in GC?     Constitutional: Alert and oriented. Well appearing and in no apparent distress. HEENT:      Head: Normocephalic and atraumatic.         Eyes: Conjunctivae are normal. Sclera is non-icteric.       Mouth/Throat: Mucous membranes are moist.  Oropharynx is clear with no swelling, erythema, exudates      Ear: External ear canal was clear with no wax impaction, no signs of external otitis,, TMs are visualized and clear with no fluid collection, no bulging.      Neck: Supple with no signs of meningismus. Cardiovascular: Regular rate and rhythm.  Respiratory: Normal respiratory effort.  Musculoskeletal: No edema, cyanosis, or  erythema of extremities. Neurologic: Normal speech and language. Face is symmetric. Moving all extremities. No gross focal neurologic deficits are appreciated. Skin: Skin is warm, dry and intact. No rash noted. Psychiatric: Mood and affect are normal. Speech and behavior are normal.  ____________________________________________   LABS (all labs ordered are listed, but only abnormal results are displayed)  Labs Reviewed - No data to display ____________________________________________  EKG  none  ____________________________________________  RADIOLOGY  none  ____________________________________________   PROCEDURES  Procedure(s) performed: None Procedures Critical Care performed:  None ____________________________________________   INITIAL IMPRESSION / ASSESSMENT AND PLAN / ED COURSE   19 y.o. male who presents for evaluation of right ear pain.  Patient symptoms seem to be ongoing since he had a retropharyngeal abscess which was treated with steroids and antibiotics.  He reports that his neck pain, difficulty swallowing and sore throat have resolved.  The ear pain seems to be improving but not fully resolved.  He reports is as an intermittent sharp pain associated with decreased hearing.  The pain is not constant.  On exam there are no acute findings, TMs are visualized with no evidence of effusion, otitis media, fluid collection, wax impaction.  Oropharynx is clear with no exudate, erythema, tonsillar hypertrophy, or peritonsillar abscess.  With no neck pain or difficulty swallowing and no fever low suspicion for recurrence of the retropharyngeal abscess at this time.  Most likely leftover inflammation from that prior infection.  Will refer patient to ENT who saw him during his hospitalization.  Discussed my standard return precautions and close follow-up.  Old medical records reviewed.      _____________________________________________ Please note:  Patient was evaluated in  Emergency Department today for the symptoms described in the history of present illness. Patient was evaluated in the context of the global COVID-19 pandemic, which necessitated consideration that the patient might be at risk for infection with the SARS-CoV-2 virus that causes COVID-19. Institutional protocols and algorithms that pertain to the evaluation of patients at risk for COVID-19 are in a state of rapid change based on information released by regulatory bodies including the CDC and federal and state organizations. These policies and algorithms were followed during the patient's care in the ED.  Some ED evaluations and interventions may be delayed as a result of limited staffing during the pandemic.   [place] Controlled Substance Database was reviewed by me. ____________________________________________   FINAL CLINICAL IMPRESSION(S) / ED DIAGNOSES   Final diagnoses:  Right ear pain      NEW MEDICATIONS STARTED DURING THIS VISIT:  ED Discharge Orders    None       Note:  This document was prepared using Dragon voice recognition software and may include unintentional dictation errors.    Don Perking, [place], MD 01/27/20 [phone]

## 2020-01-27 NOTE — Discharge Instructions (Addendum)
Your exam is normal today. Give it a few days and if the symptoms are not resolved or if the symptoms are getting worse, please return to the ER or go see an ENT specialist. Dr. Mamie Nick office number is listed on your discharge papers.  If you start having difficulty swallowing, neck pain, sore throat, or fever please return to the emergency room.

## 2020-04-27 IMAGING — CT CT HEAD W/O CM
3 series · 15 of 47 positions shown, 18 images · non-contrast
Comparison: None.

CLINICAL DATA: Initial evaluation for acute dizziness.

EXAM:
CT HEAD WITHOUT CONTRAST
TECHNIQUE: Contiguous axial images were obtained from the base of the skull
through the vertex without intravenous contrast.

[Series 2: head wo · axial · 0.45mm/px · z∈[-109,+16]mm · 9 of 31 slices shown, 12 images]
[im 3/31  brain]
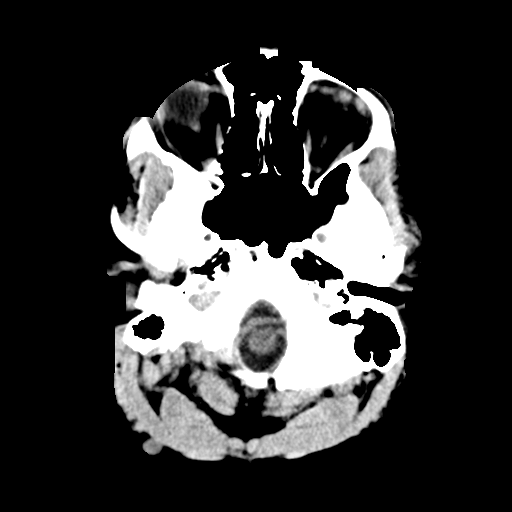
[im 3/31  bone]
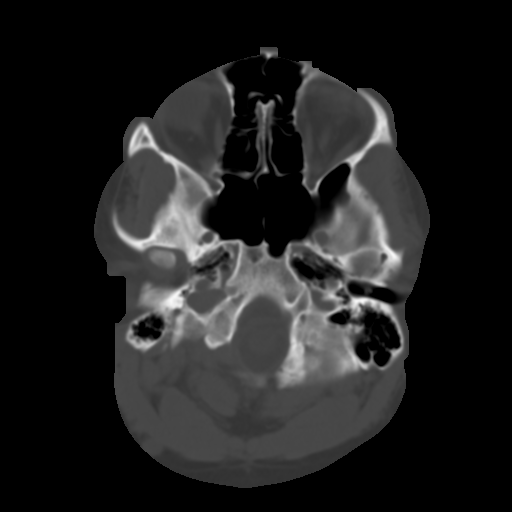
[im 6/31  brain]
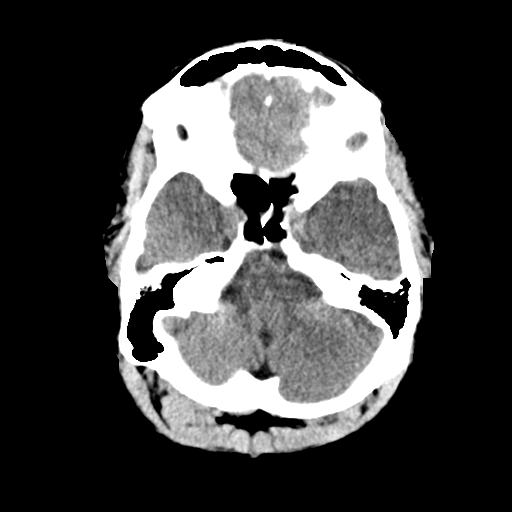
[im 9/31  brain]
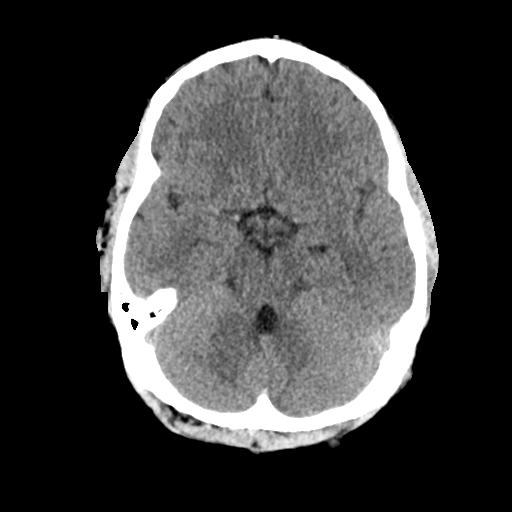
[im 12/31  brain]
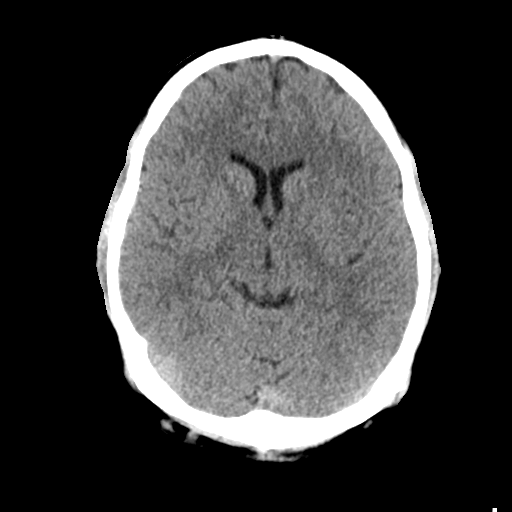
[im 16/31  brain]
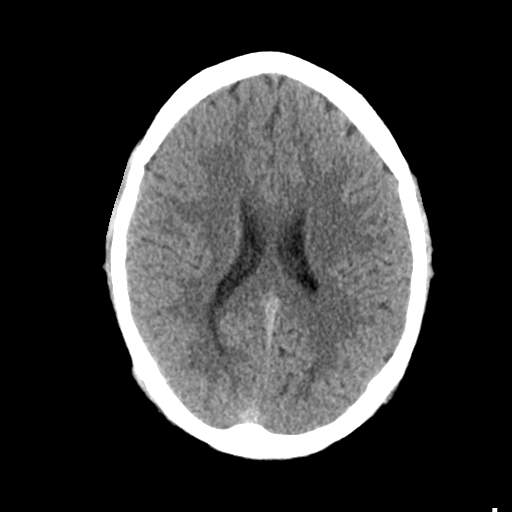
[im 16/31  bone]
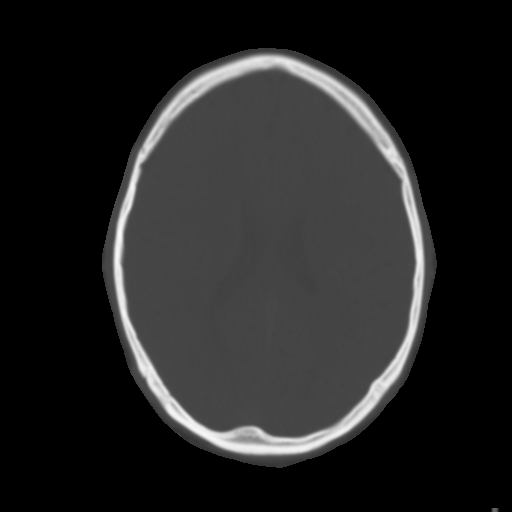
[im 19/31  brain]
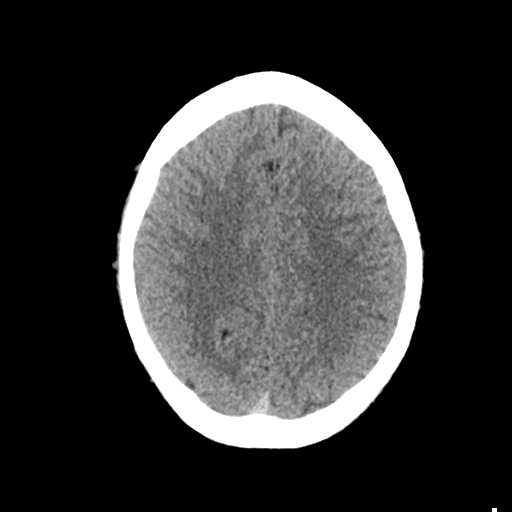
[im 22/31  brain]
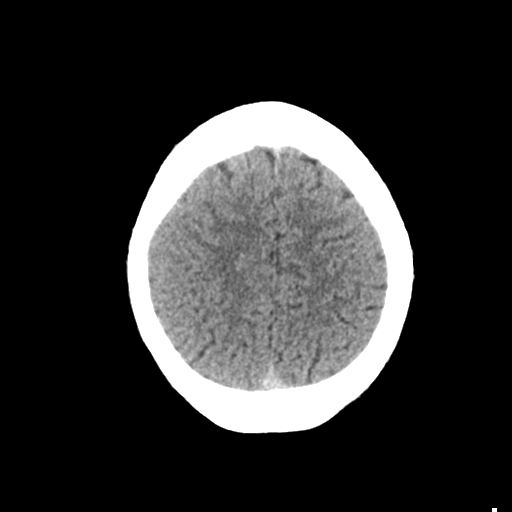
[im 25/31  brain]
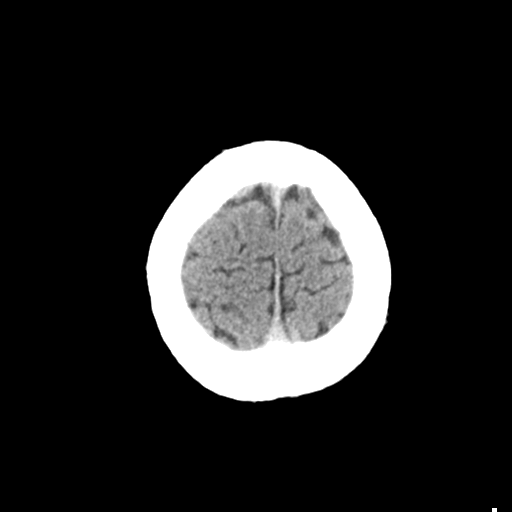
[im 28/31  brain]
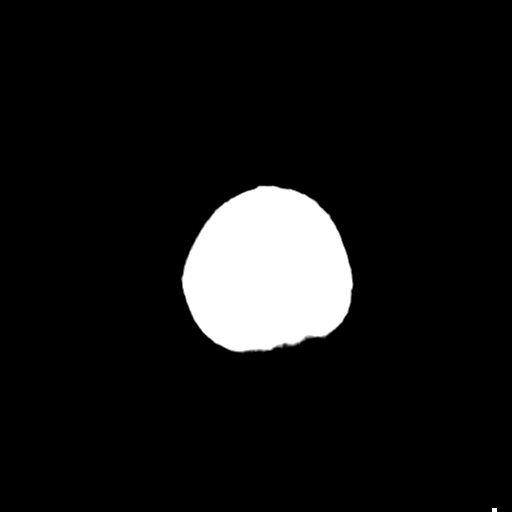
[im 28/31  bone]
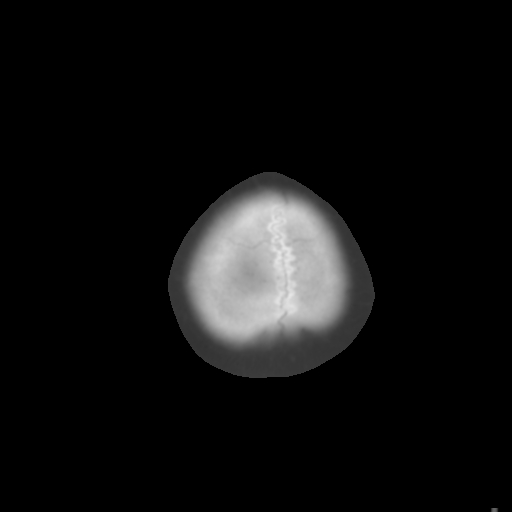

[Series 4: coronal soft tissue · coronal · 0.30mm/px · 3 of 67 slices shown]
[im 24/67  brain]
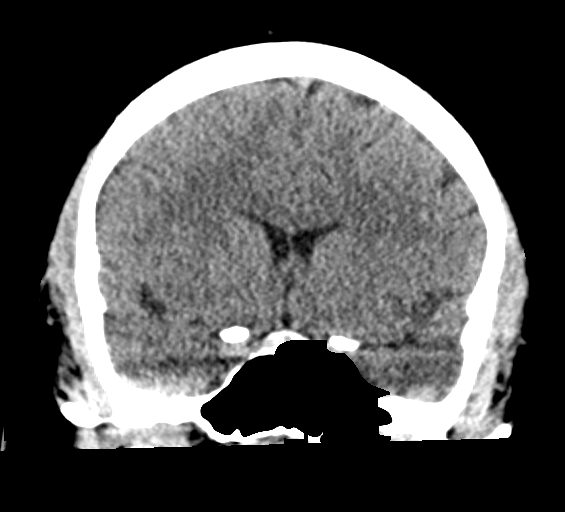
[im 30/67  brain]
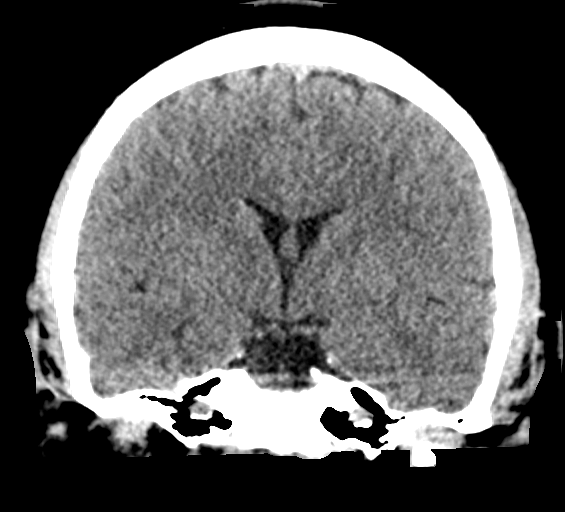
[im 37/67  brain]
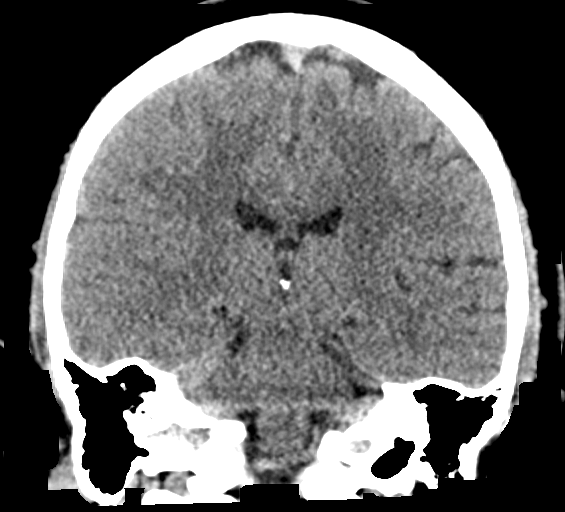

[Series 5: sagittal soft tissue · sagittal · 0.30mm/px · 3 of 56 slices shown]
[im 19/56  brain]
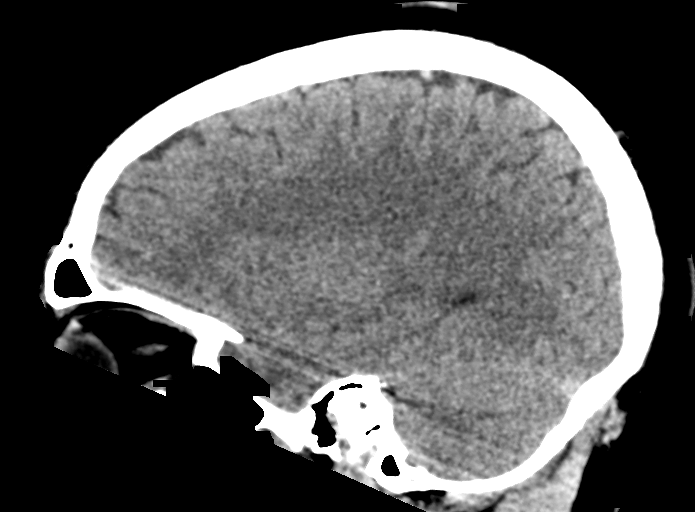
[im 28/56  brain]
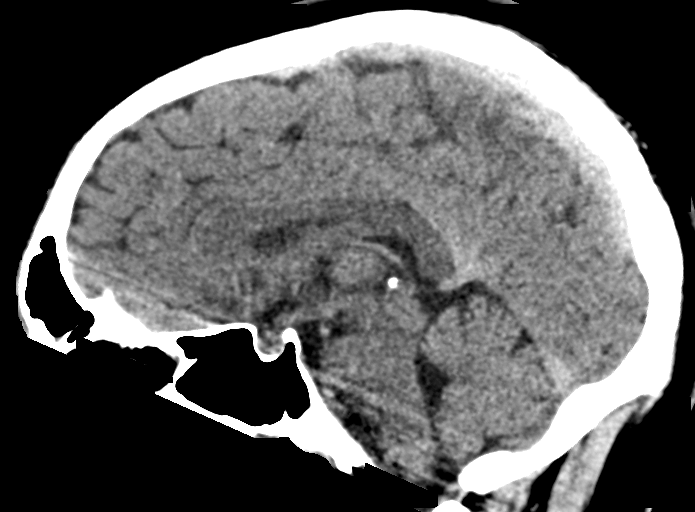
[im 37/56  brain]
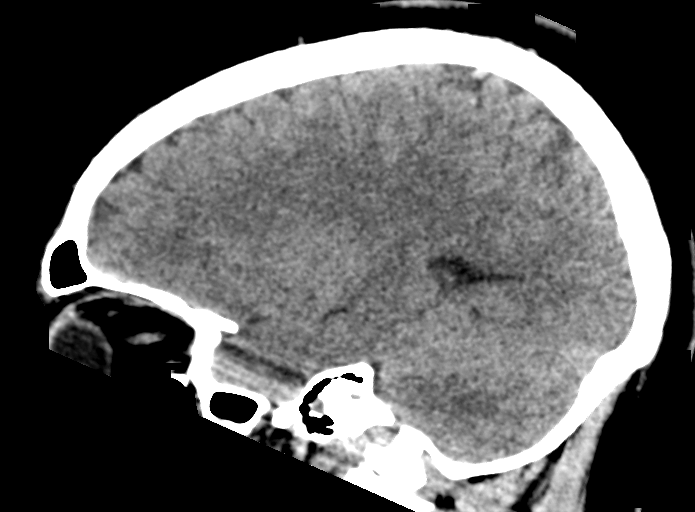

[15 of 47 positions shown; findings below may reference images not displayed]

FINDINGS: Brain: Cerebral volume within normal limits for patient age.

No evidence for acute intracranial hemorrhage. No findings to
suggest acute large vessel territory infarct. No mass lesion,
midline shift, or mass effect. Ventricles are normal in size without
evidence for hydrocephalus. No extra-axial fluid collection
identified.

Vascular: No hyperdense vessel identified.

Skull: Scalp soft tissues demonstrate no acute abnormality.
Calvarium intact.

Sinuses/Orbits: Globes and orbital soft tissues within normal
limits.

Visualized paranasal sinuses are clear. No mastoid effusion.
IMPRESSION: Normal head CT.  No acute intracranial abnormality identified.

## 2020-06-30 ENCOUNTER — Other Ambulatory Visit: Payer: Self-pay

## 2020-06-30 ENCOUNTER — Ambulatory Visit
Admission: EM | Admit: 2020-06-30 | Discharge: 2020-06-30 | Disposition: A | Discharge status: Home / Self Care | Payer: Medicaid Other | Attending: Sports Medicine | Admitting: Sports Medicine

## 2020-06-30 ENCOUNTER — Encounter: Payer: Self-pay | Admitting: Emergency Medicine

## 2020-06-30 DIAGNOSIS — Z20822 Contact with and (suspected) exposure to covid-19: Secondary | ICD-10-CM | POA: Insufficient documentation

## 2020-06-30 DIAGNOSIS — J069 Acute upper respiratory infection, unspecified: Secondary | ICD-10-CM | POA: Diagnosis not present

## 2020-06-30 DIAGNOSIS — R059 Cough, unspecified: Secondary | ICD-10-CM | POA: Insufficient documentation

## 2020-06-30 LAB — RESP PANEL BY RT-PCR (FLU A&B, COVID) ARPGX2
Influenza A by PCR: NEGATIVE
Influenza B by PCR: NEGATIVE
SARS Coronavirus 2 by RT PCR: NEGATIVE

## 2020-06-30 NOTE — ED Triage Notes (Signed)
Patient c/o cough and nasal congestion that started 3 days ago.

## 2020-06-30 NOTE — Discharge Instructions (Addendum)
Today did not reveal the presence of strep or flu.  Use Tylenol and ibuprofen as needed for body aches.  Use over-the-counter decongestants like Advil Cold and Sinus to help with headache and congestion.  You can also perform sinus irrigation with a [employer] sinus rinse kit and distilled water.  Do not use tap water.  If you develop new or worsening symptoms return for reevaluation or see your primary care provider.

## 2020-06-30 NOTE — ED Provider Notes (Signed)
[facility]    CSN: [id number] Arrival date & time: 06/30/20  1505      History   Chief Complaint Chief Complaint  Patient presents with  . Cough  . Nasal Congestion    HPI Ethan Brewer is a 19 y.o. male.   HPI   19 year old male here for evaluation of cough and nasal congestion.  Patient reports that he has had symptoms for the past 3 days.  He states in the morning when he gets up he blows out green nasal discharge but otherwise he does not have any.  Patient does endorse some facial pressure when he leans over but otherwise not an occasional headache.  Patient denies fever, sore throat, runny nose, ear pain or pressure, changes to sense of taste or smell, shortness of breath or wheezing, GI complaints, or body aches.  Patient has not had his Covid or flu vaccine.  Past Medical History:  Diagnosis Date  . Asthma   . GERD (gastroesophageal reflux disease)     Patient Active Problem List   Diagnosis Date Noted  . Retropharyngeal abscess 01/07/2020  . Strep pharyngitis   . Subscapular bursitis 12/05/2016  . 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 Abnormal ECG 07/30/2013    Past Surgical History:  Procedure Laterality Date  . TONSILLECTOMY         Home Medications    Prior to Admission medications   Medication Sig Start Date End Date Taking? Authorizing Provider  fluticasone (FLONASE) 50 MCG/ACT nasal spray Place 1-2 sprays into both nostrils daily as needed for allergies or rhinitis.   Yes [provider]  loratadine (CLARITIN) 10 MG tablet Take 10 mg by mouth daily as needed for allergies.   Yes [provider]  alum & mag hydroxide-simeth (MAALOX/MYLANTA) 200-200-20 MG/5ML suspension Take 15-30 mLs by mouth every 6 (six) hours as needed for indigestion or heartburn.    [provider]  methylPREDNISolone (MEDROL DOSEPAK) 4 MG TBPK tablet Take as directed on inside of package 01/08/20   Fritzi Mandes, MD  omeprazole (PRILOSEC  OTC) 20 MG tablet Take 20 mg by mouth daily as needed (acid reflux symptoms).    [provider]    Family History History reviewed. No pertinent family history.  Social History Social History   Tobacco Use  . Smoking status: Never Smoker  . Smokeless tobacco: Never Used  Substance Use Topics  . Alcohol use: Never  . Drug use: Never     Allergies   Prednisone   Review of Systems Review of Systems  Constitutional: Negative for activity change, appetite change and fever.  HENT: Positive for congestion. Negative for ear pain, rhinorrhea, sinus pressure, sinus pain and sore throat.   Respiratory: Positive for cough. Negative for shortness of breath and wheezing.   Cardiovascular: Negative for chest pain.  Gastrointestinal: Negative for diarrhea, nausea and vomiting.  Musculoskeletal: Negative for arthralgias and myalgias.  Skin: Negative for rash.  Neurological: Positive for headaches.  Hematological: Negative.   Psychiatric/Behavioral: Negative.      Physical Exam Triage Vital Signs ED Triage Vitals  Enc Vitals Group     BP 06/30/20 1523 (!) 150/87     Pulse Rate 06/30/20 1523 74     Resp 06/30/20 1523 18     Temp 06/30/20 1523 98 F (36.7 C)     Temp Source 06/30/20 1523 Oral     SpO2 06/30/20 1523 99 %     Weight 06/30/20 1524 240 lb (108.9 kg)  Height 06/30/20 1524 5' 9" (1.753 m)     Head Circumference --      Peak Flow --      Pain Score 06/30/20 1524 0     Pain Loc --      Pain Edu? --      Excl. in [place]? --    No data found.  Updated Vital Signs BP (!) 150/87 (BP Location: Right Arm)   Pulse 74   Temp 98 F (36.7 C) (Oral)   Resp 18   Ht 5' 9" (1.753 m)   Wt 240 lb (108.9 kg)   SpO2 99%   BMI 35.44 kg/m   Visual Acuity Right Eye Distance:   Left Eye Distance:   Bilateral Distance:    Right Eye Near:   Left Eye Near:    Bilateral Near:     Physical Exam Vitals and nursing note reviewed.  Constitutional:      General: He  is not in acute distress.    Appearance: Normal appearance. He is not toxic-appearing.  HENT:     Head: Normocephalic and atraumatic.     Right Ear: Tympanic membrane, ear canal and external ear normal.     Left Ear: Tympanic membrane, ear canal and external ear normal.     Nose: Congestion and rhinorrhea present.     Comments: Nasal mucosa is mildly erythematous and edematous with scant clear nasal discharge.    Mouth/Throat:     Mouth: Mucous membranes are moist.     Pharynx: Oropharynx is clear. Posterior oropharyngeal erythema present. No oropharyngeal exudate.     Comments: Patient has mild posterior oropharyngeal erythema with clear postnasal drip. Cardiovascular:     Rate and Rhythm: Normal rate and regular rhythm.     Pulses: Normal pulses.     Heart sounds: Normal heart sounds. No murmur heard. No gallop.   Pulmonary:     Effort: Pulmonary effort is normal.     Breath sounds: Normal breath sounds. No wheezing, rhonchi or rales.  Musculoskeletal:     Cervical back: Normal range of motion and neck supple.  Lymphadenopathy:     Cervical: No cervical adenopathy.  Skin:    General: Skin is warm and dry.     Capillary Refill: Capillary refill takes less than 2 seconds.     Findings: No erythema or rash.  Neurological:     General: No focal deficit present.     Mental Status: He is alert and oriented to person, place, and time.  Psychiatric:        Mood and Affect: Mood normal.        Behavior: Behavior normal.        Thought Content: Thought content normal.        Judgment: Judgment normal.      UC Treatments / Results  Labs (all labs ordered are listed, but only abnormal results are displayed) Labs Reviewed  RESP PANEL BY RT-PCR (FLU A&B, COVID) [id number]    EKG   Radiology No results found.  Procedures Procedures (including critical care time)  Medications Ordered in UC Medications - No data to display  Initial Impression / Assessment and Plan / UC Course   I have reviewed the triage vital signs and the nursing notes.  Pertinent labs & imaging results that were available during my care of the patient were reviewed by me and considered in my medical decision making (see chart for details).   Patient is here for evaluation of  cough and congestion that started 3 days ago.  Patient is nontoxic in appearance.  There is no pharyngeal erythema, edema, and clear discharge and postnasal drip are consistent with URI.  Lungs are clear to auscultation.  Patient has similar symptoms to his wife and son.  His wife was evaluated earlier this morning and tested negative for Covid or flu.  We will send respiratory triplex panel.  Respiratory panel is negative for Covid or flu.  We will discharge patient home with a diagnosis of viral URI.   Final Clinical Impressions(s) / UC Diagnoses   Final diagnoses:  Upper respiratory tract infection, unspecified type     Discharge Instructions     Today did not reveal the presence of strep or flu.  Use Tylenol and ibuprofen as needed for body aches.  Use over-the-counter decongestants like Advil Cold and Sinus to help with headache and congestion.  You can also perform sinus irrigation with a [employer] sinus rinse kit and distilled water.  Do not use tap water.  If you develop new or worsening symptoms return for reevaluation or see your primary care provider.    ED Prescriptions    None     PDMP not reviewed this encounter.   Margarette Canada, NP 06/30/20 [phone]

## 2020-08-15 IMAGING — CR CHEST - 2 VIEW
1 series · 2 of 2 positions shown · non-contrast
Comparison: 12/12/2018

CLINICAL DATA: Sore throat.

EXAM:
CHEST - 2 VIEW

[Series 1: w chest pa · 0.14mm/px · 2 of 2 slices shown]
[im 1/2]
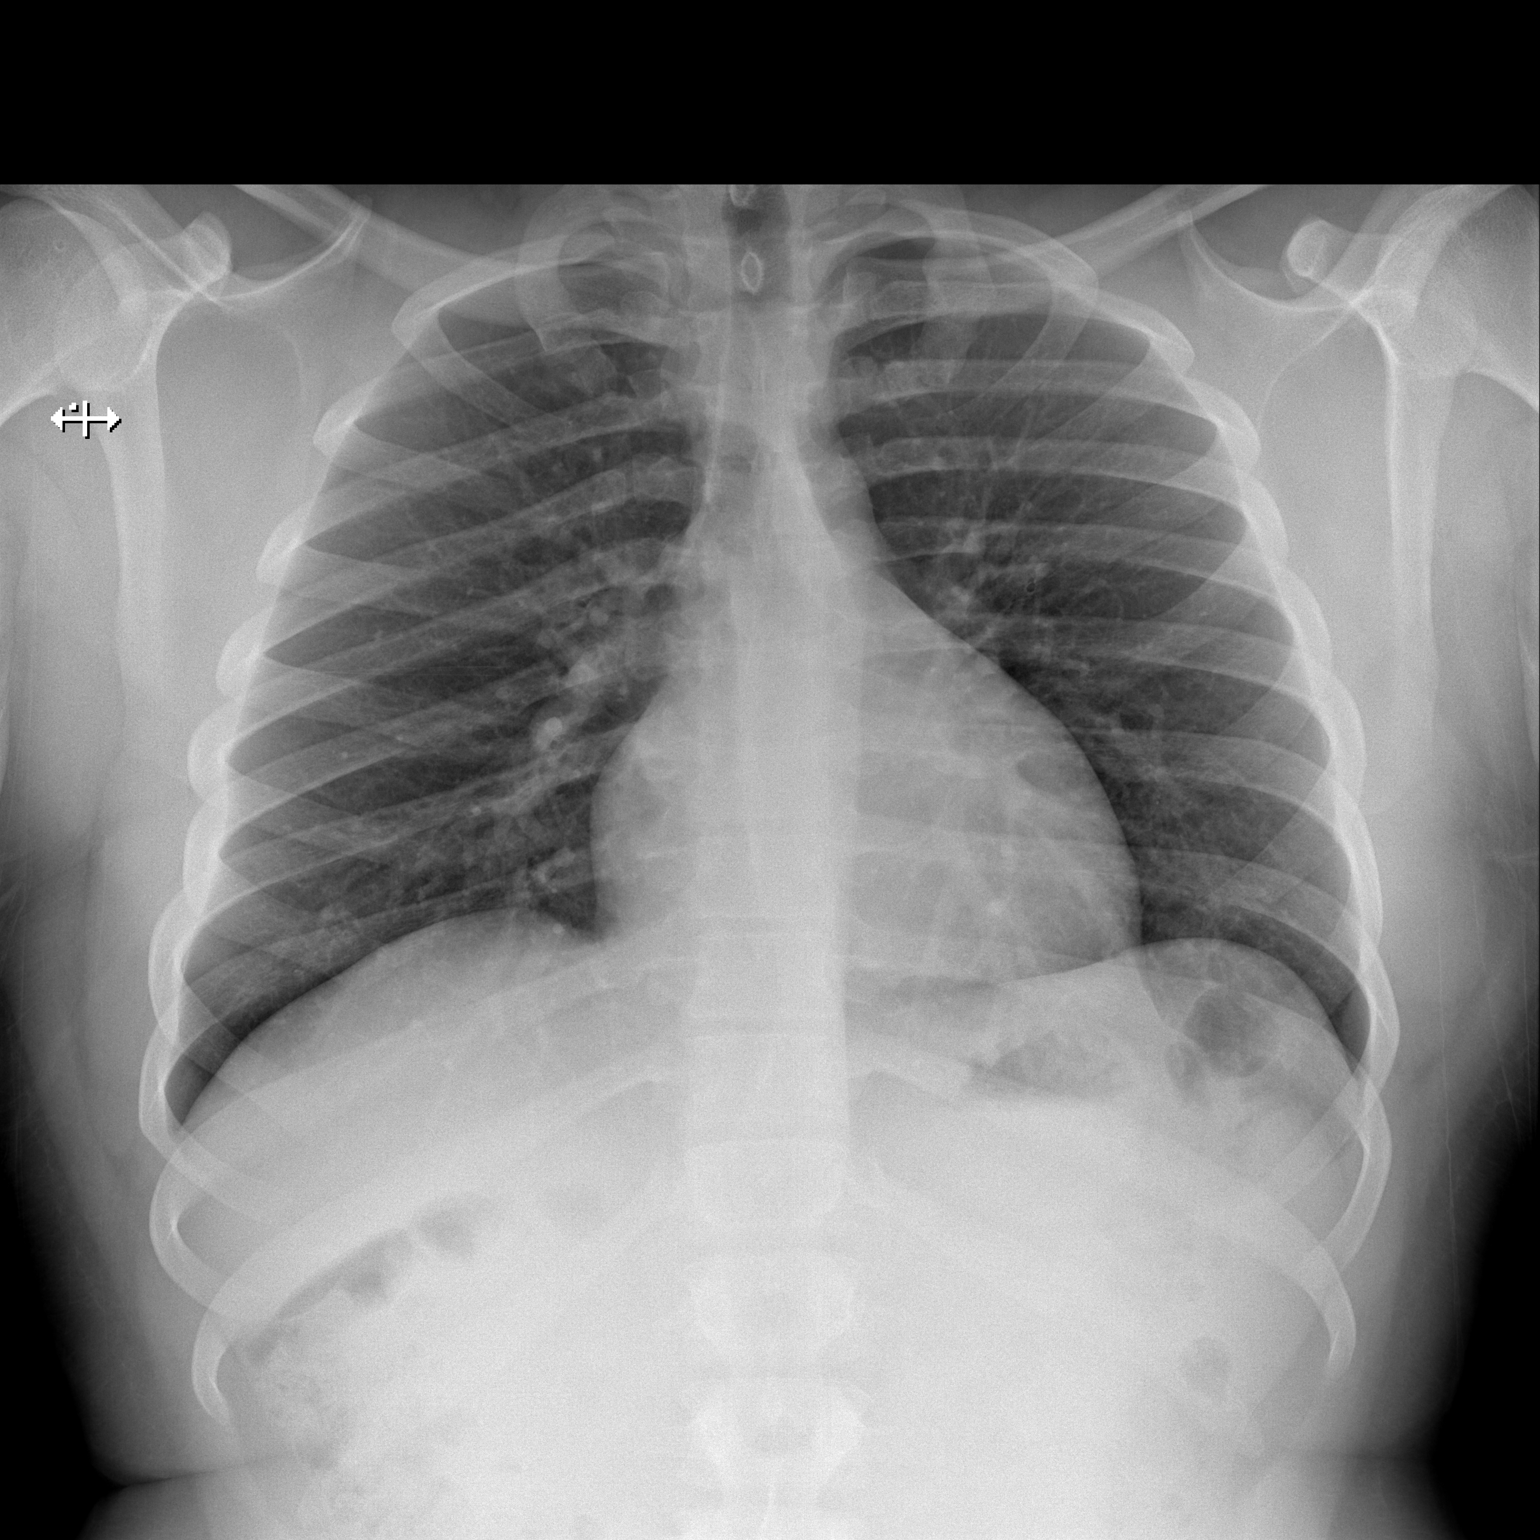
[im 2/2]
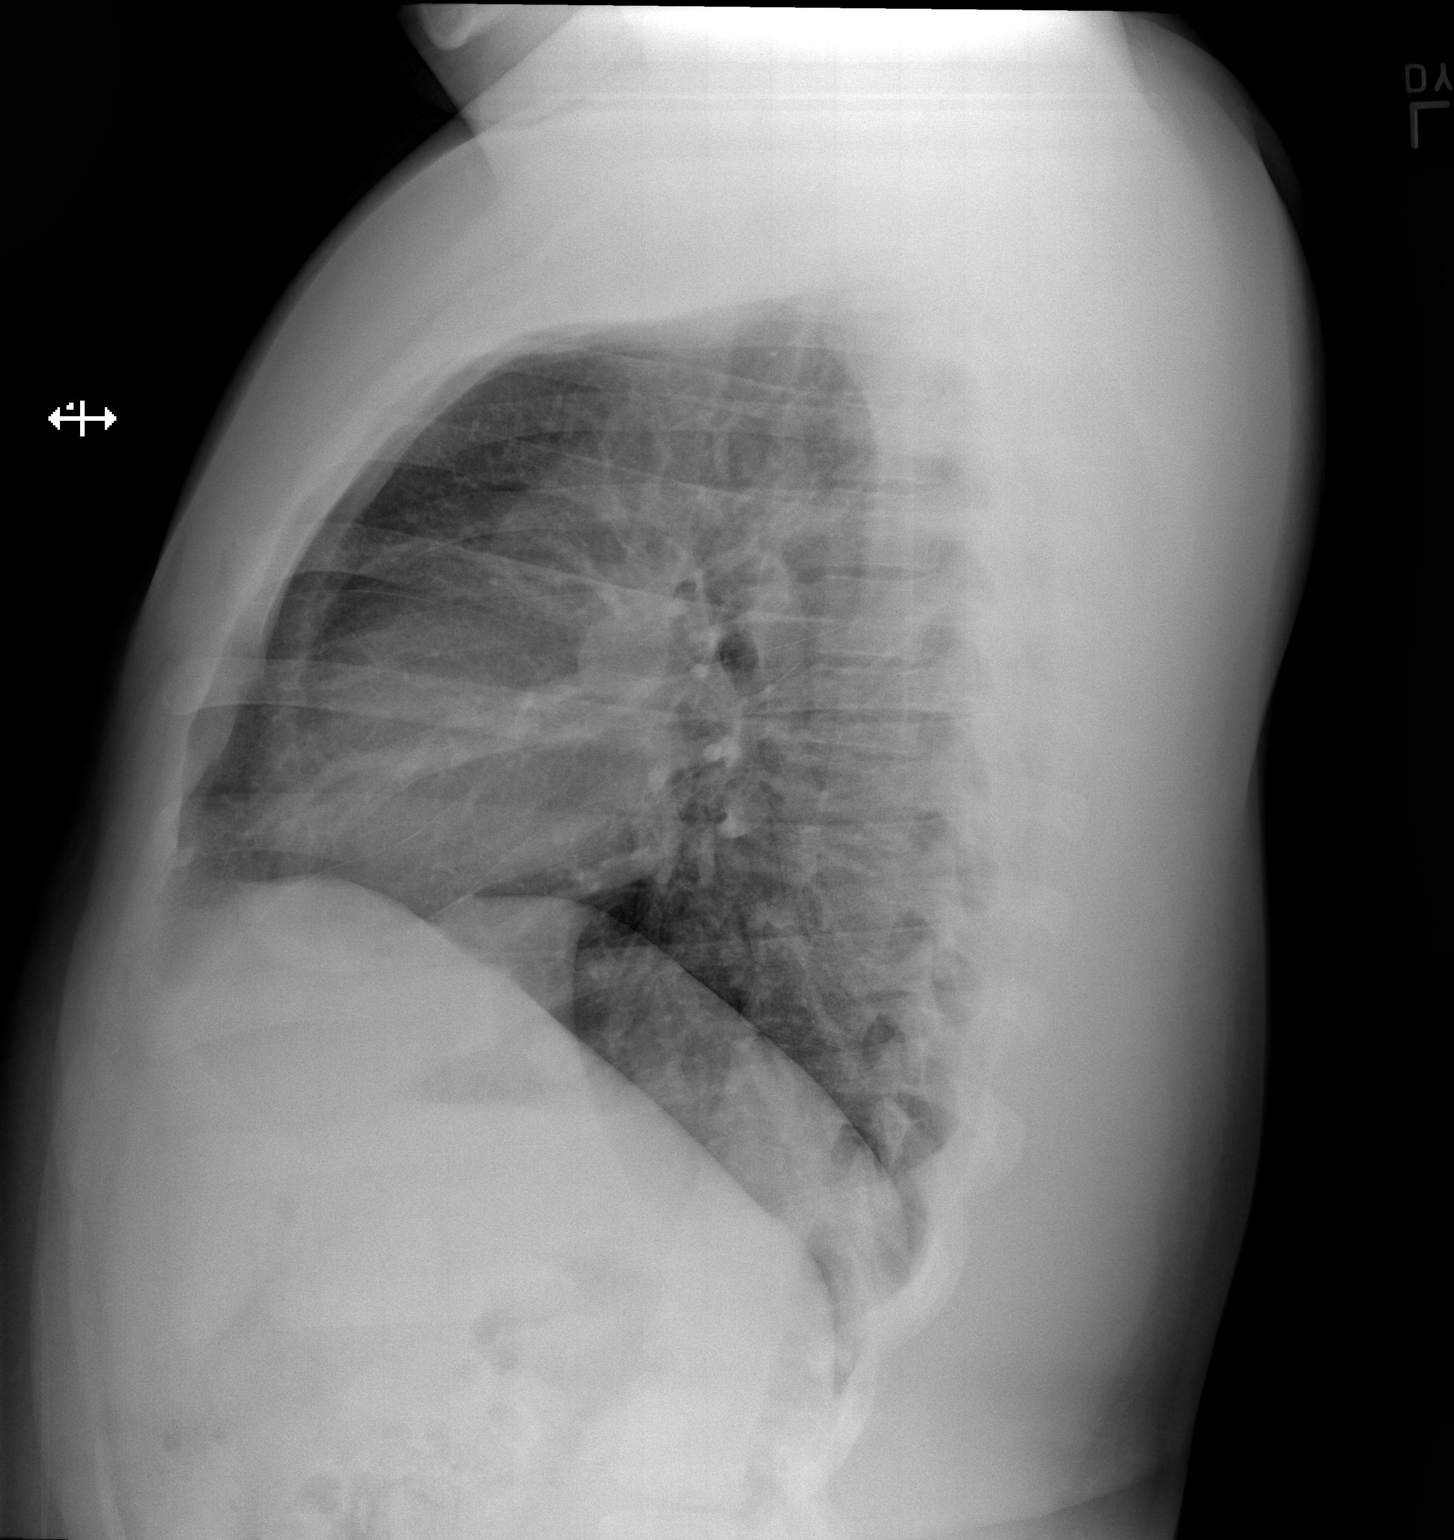

[2 of 2 positions shown; findings below may reference images not displayed]

FINDINGS: The heart size and mediastinal contours are within normal limits.
Both lungs are clear. The visualized skeletal structures are
unremarkable.
IMPRESSION: No active cardiopulmonary disease.

## 2020-11-03 ENCOUNTER — Ambulatory Visit
Admission: EM | Admit: 2020-11-03 | Discharge: 2020-11-03 | Disposition: A | Discharge status: Home / Self Care | Payer: Medicaid Other

## 2020-11-03 ENCOUNTER — Other Ambulatory Visit: Payer: Self-pay

## 2020-11-03 DIAGNOSIS — R0789 Other chest pain: Secondary | ICD-10-CM | POA: Diagnosis not present

## 2020-11-03 NOTE — ED Triage Notes (Signed)
Pt c/o a possible pulled muscle on his left chest area. Pt was pulling himself down off a rafter and hurt his chest muscle early last week. Pt reports continued pain with movement and deep inhalation. Pt denies rib pain. Pt has taken 3 at-home COVID tests and all were negative.

## 2020-11-03 NOTE — ED Provider Notes (Signed)
[facility]    CSN: [id number] Arrival date & time: 11/03/20  1906      History   Chief Complaint Chief Complaint  Patient presents with  . Muscle Pain    Left chest/shoulder    HPI Ethan Brewer is a 20 y.o. male.   HPI   Here for evaluation of left chest wall pain and left-sided back pain.  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  Since then he has had continued pain that increases with movement and also with deep inhalation.  Patient states that sometimes he feels like he is having some shortness of breath but he has not had any wheezing.  Patient states that he is working in a dusty environment and he has had some coughing which tends to aggravate his symptoms.  Patient denies numbness, tingling, or weakness in his left arm or hand.  Past Medical History:  Diagnosis Date  . Asthma   . GERD (gastroesophageal reflux disease)     Patient Active Problem List   Diagnosis Date Noted  . Retropharyngeal abscess 01/07/2020  . Strep pharyngitis   . Subscapular bursitis 12/05/2016  . 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 Abnormal ECG 07/30/2013    Past Surgical History:  Procedure Laterality Date  . TONSILLECTOMY         Home Medications    Prior to Admission medications   Medication Sig Start Date End Date Taking? Authorizing Provider  fluticasone (FLONASE) 50 MCG/ACT nasal spray Place 1-2 sprays into both nostrils daily as needed for allergies or rhinitis.  11/03/20  [provider]  loratadine (CLARITIN) 10 MG tablet Take 10 mg by mouth daily as needed for allergies.  11/03/20  [provider]    Family History History reviewed. No pertinent family history.  Social History Social History    Tobacco Use  . Smoking status: Never Smoker  . Smokeless tobacco: Never Used  Vaping Use  . Vaping Use: Never used  Substance Use Topics  . Alcohol use: Never  . Drug use: Never     Allergies   Prednisone   Review of Systems Review of Systems  Constitutional: Negative for activity change, appetite change and fever.  Respiratory: Positive for cough and shortness of breath.   Cardiovascular: Positive for chest pain.  Musculoskeletal: Positive for back pain and myalgias. Negative for arthralgias.  Neurological: Negative for weakness and numbness.  Hematological: Negative.   Psychiatric/Behavioral: Negative.      Physical Exam Triage Vital Signs ED Triage Vitals  Enc Vitals Group     BP 11/03/20 1922 (!) 144/76     Pulse Rate 11/03/20 1922 69     Resp 11/03/20 1922 18     Temp 11/03/20 1922 98.9 F (37.2 C)     Temp Source 11/03/20 1922 Oral     SpO2 11/03/20 1922 98 %     Weight 11/03/20 1920 240 lb (108.9 kg)     Height 11/03/20 1920 5\' 9"  (1.753 m)     Head Circumference --      Peak Flow --      Pain Score --      Pain Loc --      Pain  Edu? --      Excl. in [place]? --    No data found.  Updated Vital Signs BP (!) 144/76 (BP Location: Left Arm)   Pulse 69   Temp 98.9 F (37.2 C) (Oral)   Resp 18   Ht 5\' 9"  (1.753 m)   Wt 240 lb (108.9 kg)   SpO2 98%   BMI 35.44 kg/m   Visual Acuity Right Eye Distance:   Left Eye Distance:   Bilateral Distance:    Right Eye Near:   Left Eye Near:    Bilateral Near:     Physical Exam Vitals and nursing note reviewed.  Constitutional:      General: He is not in acute distress.    Appearance: Normal appearance. He is not ill-appearing.  HENT:     Head: Normocephalic and atraumatic.  Cardiovascular:     Rate and Rhythm: Normal rate and regular rhythm.     Pulses: Normal pulses.     Heart sounds: Normal heart sounds. No murmur heard. No gallop.   Pulmonary:     Effort: Pulmonary effort is normal.     Breath  sounds: Normal breath sounds. No wheezing, rhonchi or rales.  Musculoskeletal:        General: Tenderness present. No swelling or deformity.  Skin:    General: Skin is warm and dry.     Capillary Refill: Capillary refill takes less than 2 seconds.  Neurological:     General: No focal deficit present.     Mental Status: He is alert.  Psychiatric:        Mood and Affect: Mood normal.        Behavior: Behavior normal.        Thought Content: Thought content normal.        Judgment: Judgment normal.      UC Treatments / Results  Labs (all labs ordered are listed, but only abnormal results are displayed) Labs Reviewed - No data to display  EKG   Radiology No results found.  Procedures Procedures (including critical care time)  Medications Ordered in UC Medications - No data to display  Initial Impression / Assessment and Plan / UC Course  I have reviewed the triage vital signs and the nursing notes.  Pertinent labs & imaging results that were available during my care of the patient were reviewed by me and considered in my medical decision making (see chart for details).   Is a very pleasant 20 year old male here for evaluation of left chest wall pain front and back that is been going on for the past week.  Pain initially started after he felt a pull in his chest when he was letting himself down out of her after.  He denies numbness or tingling in his hand or weakness in his grip.  Patient does state that he is working in dusty environment which is causing him to cough and sometimes that makes the pain worse.  Physical exam reveals 5/5 grip strength and upper extremity push pull strength on the left.  Now the pushing or pulling aggravates his chest wall pain.  Patient does have tenderness when palpating the costochondral joints of the third and fourth rib on the left.  He has no pain when palpating the ribs on the midclavicular line.  Similarly, patient has some muscle tension when  palpating the body of the trapezius muscle between the left scapula and the thoracic spine without any overt spasm.  Patient's exam is consistent with  musculoskeletal chest wall pain.  We will have patient use over-the-counter NSAIDs as well as moist heat to help alleviate pain and improve blood flow.  We will also give patient stretching exercises to help open up his chest wall as well as help eliminate the tightness in the trapezius muscle.   Final Clinical Impressions(s) / UC Diagnoses   Final diagnoses:  Left-sided chest wall pain     Discharge Instructions     Use over-the-counter ibuprofen, up to 3 tablets with food every 6 hours, as needed for pain and inflammation.  Increase your oral fluid intake so that you are increasing your bodies hydration and therefore fluid lubrication.  Apply moist heat to your chest wall and to the muscles of your back for 20 minutes at a time 2-3 times a day to help improve blood flow and facilitate relaxation of the muscle.  Perform the stretches that we discussed 2-3 times a day and as needed for tightness in your chest or your back.  Wear the N95 respirator to help filter out dust particles so that you will not cough which will increase your pain.  Return for reevaluation for any new or worsening symptoms.    ED Prescriptions    None     PDMP not reviewed this encounter.   Becky Augusta, NP 11/03/20 2012

## 2020-11-03 NOTE — Discharge Instructions (Addendum)
Use over-the-counter ibuprofen, up to 3 tablets with food every 6 hours, as needed for pain and inflammation.  Increase your oral fluid intake so that you are increasing your bodies hydration and therefore fluid lubrication.  Apply moist heat to your chest wall and to the muscles of your back for 20 minutes at a time 2-3 times a day to help improve blood flow and facilitate relaxation of the muscle.  Perform the stretches that we discussed 2-3 times a day and as needed for tightness in your chest or your back.  Wear the N95 respirator to help filter out dust particles so that you will not cough which will increase your pain.  Return for reevaluation for any new or worsening symptoms.

## 2020-11-13 ENCOUNTER — Encounter: Payer: Self-pay | Admitting: Emergency Medicine

## 2020-11-13 ENCOUNTER — Other Ambulatory Visit: Payer: Self-pay

## 2020-11-13 ENCOUNTER — Ambulatory Visit
Admission: EM | Admit: 2020-11-13 | Discharge: 2020-11-13 | Disposition: A | Discharge status: Home / Self Care | Payer: Medicaid Other | Attending: Family Medicine | Admitting: Family Medicine

## 2020-11-13 DIAGNOSIS — R059 Cough, unspecified: Secondary | ICD-10-CM | POA: Diagnosis not present

## 2020-11-13 MED ORDER — PROMETHAZINE-DM 6.25-15 MG/5ML PO SYRP
5.0000 mL | ORAL_SOLUTION | Freq: Four times a day (QID) | ORAL | 0 refills | Status: DC | PRN
Start: 2020-11-13 — End: 2022-09-12

## 2020-11-13 MED ORDER — ALBUTEROL SULFATE HFA 108 (90 BASE) MCG/ACT IN AERS: 1.0000 | INHALATION_SPRAY | Freq: Four times a day (QID) | RESPIRATORY_TRACT | 1 refills | Status: DC | PRN

## 2020-11-13 NOTE — ED Provider Notes (Signed)
[facility]    CSN: [id number] Arrival date & time: 11/13/20  1411      History   Chief Complaint Chief Complaint  Patient presents with  . Cough   HPI  20 year old male presents with cough.  Cough over the past week.  He states that his cough is productive.  He has difficulty getting the mucus up.  No wheezing.  No fever.  He has had negative COVID test.  Has a history of asthma.  His out of his inhaler.  No other respiratory symptoms.  No other complaints.  Past Medical History:  Diagnosis Date  . Asthma   . GERD (gastroesophageal reflux disease)     Patient Active Problem List   Diagnosis Date Noted  . Retropharyngeal abscess 01/07/2020  . Strep pharyngitis   . Subscapular bursitis 12/05/2016  . 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 Abnormal ECG 07/30/2013   Past Surgical History:  Procedure Laterality Date  . TONSILLECTOMY     Home Medications    Prior to Admission medications   Medication Sig Start Date End Date Taking? Authorizing Provider  albuterol (VENTOLIN HFA) 108 (90 Base) MCG/ACT inhaler Inhale 1-2 puffs into the lungs every 6 (six) hours as needed for wheezing or shortness of breath (Cough). 11/13/20  Yes Devanny Palecek G, DO  promethazine-dextromethorphan (PROMETHAZINE-DM) 6.25-15 MG/5ML syrup Take 5 mLs by mouth 4 (four) times daily as needed for cough. 11/13/20  Yes Kimmberly Wisser G, DO  fluticasone (FLONASE) 50 MCG/ACT nasal spray Place 1-2 sprays into both nostrils daily as needed for allergies or rhinitis.  11/03/20  [provider]  loratadine (CLARITIN) 10 MG tablet Take 10 mg by mouth daily as needed for allergies.  11/03/20  [provider]    Family History History reviewed. No pertinent family history.  Social History Social History   Tobacco Use  . Smoking status: Never Smoker  . Smokeless tobacco: Never Used  Vaping Use  . Vaping Use: Never used  Substance Use Topics  . Alcohol use: Never  . Drug use: Never      Allergies   Prednisone   Review of Systems Review of Systems Per HPI  Physical Exam Triage Vital Signs ED Triage Vitals  Enc Vitals Group     BP 11/13/20 1424 139/83     Pulse Rate 11/13/20 1424 60     Resp 11/13/20 1424 16     Temp 11/13/20 1424 97.9 F (36.6 C)     Temp Source 11/13/20 1424 Oral     SpO2 11/13/20 1424 99 %     Weight 11/13/20 1421 240 lb (108.9 kg)     Height 11/13/20 1421 5\' 9"  (1.753 m)     Head Circumference --      Peak Flow --      Pain Score 11/13/20 1421 0     Pain Loc --      Pain Edu? --      Excl. in [place]? --    Updated Vital Signs BP 139/83 (BP Location: Left Arm)   Pulse 60   Temp 97.9 F (36.6 C) (Oral)   Resp 16   Ht 5\' 9"  (1.753 m)   Wt 108.9 kg   SpO2 99%   BMI 35.44 kg/m   Visual Acuity Right Eye Distance:   Left Eye Distance:   Bilateral Distance:    Right Eye Near:   Left Eye Near:    Bilateral Near:     Physical Exam Constitutional:  General: He is not in acute distress.    Appearance: Normal appearance. He is obese. He is not ill-appearing.  HENT:     Head: Normocephalic and atraumatic.  Eyes:     General:        Right eye: No discharge.        Left eye: No discharge.     Conjunctiva/sclera: Conjunctivae normal.  Cardiovascular:     Rate and Rhythm: Normal rate and regular rhythm.     Heart sounds: No murmur heard.   Pulmonary:     Effort: Pulmonary effort is normal.     Breath sounds: Normal breath sounds. No wheezing, rhonchi or rales.  Neurological:     Mental Status: He is alert.  Psychiatric:        Mood and Affect: Mood normal.        Behavior: Behavior normal.    UC Treatments / Results  Labs (all labs ordered are listed, but only abnormal results are displayed) Labs Reviewed - No data to display  EKG   Radiology No results found.  Procedures Procedures (including critical care time)  Medications Ordered in UC Medications - No data to display  Initial Impression /  Assessment and Plan / UC Course  I have reviewed the triage vital signs and the nursing notes.  Pertinent labs & imaging results that were available during my care of the patient were reviewed by me and considered in my medical decision making (see chart for details).    20 year old male presents with cough.  Exam is unremarkable.  Has a history of asthma.  Albuterol as directed.  Promethazine DM for cough.  Final Clinical Impressions(s) / UC Diagnoses   Final diagnoses:  Cough   Discharge Instructions   None    ED Prescriptions    Medication Sig Dispense Auth. Provider   albuterol (VENTOLIN HFA) 108 (90 Base) MCG/ACT inhaler Inhale 1-2 puffs into the lungs every 6 (six) hours as needed for wheezing or shortness of breath (Cough). 18 g Ina Poupard G, DO   promethazine-dextromethorphan (PROMETHAZINE-DM) 6.25-15 MG/5ML syrup Take 5 mLs by mouth 4 (four) times daily as needed for cough. 118 mL Tommie Sams, DO     PDMP not reviewed this encounter.   Tommie Sams, [place] 11/13/20 1553

## 2020-11-13 NOTE — ED Triage Notes (Signed)
Patient c/o cough and chest congestion for over a week.  Patient states that he had a negative covid test.  Patient denies fevers.  Patient has history of asthma.

## 2021-02-16 NOTE — Progress Notes (Deleted)
Cardiology Office Note   Date:  02/16/2021   ID:  Ethan Brewer, DOB [birth year], MRN [id number]  PCP:  Nira Retort  Cardiologist:  Dr.Croitoru  No chief complaint on file.    History of Present Illness: Ethan Brewer is a 20 y.o. male who presents for ongoing assessment and management of chronic palpitations, with dizziness not necessarily associated with this.This occurred most when he was getting out of bed in the morning. No syncopal episodes.   There is no family history of cardiomyopathy or unexplained sudden death, but there is a very strong family history of premature CAD.  His mother had a coronary stent around the age of 48.  Both his maternal grandparents had myocardial infarction in their 42s and his grandfather had a defibrillator before he died.  On last visit with Dr. Royann Shivers, 07/01/2019, he discussed weight loss and ordered labs for lipid status. There were no planned diagnostic tests at that time.   Past Medical History:  Diagnosis Date   Asthma    GERD (gastroesophageal reflux disease)     Past Surgical History:  Procedure Laterality Date   TONSILLECTOMY       Current Outpatient Medications  Medication Sig Dispense Refill   albuterol (VENTOLIN HFA) 108 (90 Base) MCG/ACT inhaler Inhale 1-2 puffs into the lungs every 6 (six) hours as needed for wheezing or shortness of breath (Cough). 18 g 1   promethazine-dextromethorphan (PROMETHAZINE-DM) 6.25-15 MG/5ML syrup Take 5 mLs by mouth 4 (four) times daily as needed for cough. 118 mL 0   No current facility-administered medications for this visit.    Allergies:   Prednisone    Social History:  The patient  reports that he has never smoked. He has never used smokeless tobacco. He reports that he does not drink alcohol and does not use drugs.   Family History:  The patient's family history is not on file.    ROS: All other systems are reviewed and negative. Unless otherwise mentioned in H&P     PHYSICAL EXAM: VS:  There were no vitals taken for this visit. , BMI There is no height or weight on file to calculate BMI. GEN: Well nourished, well developed, in no acute distress HEENT: normal Neck: no JVD, carotid bruits, or masses Cardiac: ***RRR; no murmurs, rubs, or gallops,no edema  Respiratory:  Clear to auscultation bilaterally, normal work of breathing GI: soft, nontender, nondistended, + BS MS: no deformity or atrophy Skin: warm and dry, no rash Neuro:  Strength and sensation are intact Psych: euthymic mood, full affect   EKG:  EKG {ACTION; IS/IS [id number]} ordered today. The ekg ordered today demonstrates ***   Recent Labs: No results found for requested labs within last 8760 hours.    Lipid Panel    Component Value Date/Time   CHOL 174 (H) 06/20/2019 1228   TRIG 81 06/20/2019 1228   HDL 54 06/20/2019 1228   CHOLHDL 3.2 06/20/2019 1228   LDLCALC 105 06/20/2019 1228      Wt Readings from Last 3 Encounters:  11/13/20 240 lb (108.9 kg)  11/03/20 240 lb (108.9 kg)  06/30/20 240 lb (108.9 kg)      Other studies Reviewed: Echocardiogram 10/27/2018 1. The left ventricle has normal systolic function with an ejection fraction of 60-65%. The cavity size was normal. Left ventricular diastolic parameters were normal.  2. The right ventricle has normal systolic function. The cavity was normal. There is no increase in right ventricular wall thickness. Normal RVSP  3. There is mild dilatation of the aortic root, 3.5 cm  4. The aortic valve is tricuspid. Event monitor May 2020 Mediocre recording quality. Only 46% interpretable (28% uninterpretable due to artifact, not worn remainder of the time). Normal sinus rhythm with sinus arrhythmia and normal circadian variation. A single PAC is seen during the entire tracing. There are no PVCs. There is no evidence of complex atrial or ventricular arrhythmia. There is no correlation between the rhythm and the patient's  symptoms. Symptoms were described only sinus bradycardia, normal sinus rhythm and mild sinus tachycardia.   Normal event monitor.  There is no correlation between the reports of palpitations and the objective rhythm recorded.  ASSESSMENT AND PLAN:  1.  ***   Current medicines are reviewed at length with the patient today.  I have spent *** dedicated to the care of this patient on the date of this encounter to include pre-visit review of records, assessment, management and diagnostic testing,with shared decision making.  Labs/ tests ordered today include: *** Bettey Mare. Liborio Nixon, ANP, AACC   02/16/2021 5:52 PM    [facility] [address] Office [phone] Fax [phone]  Notice: This dictation was prepared with Dragon dictation along with smaller phrase technology. Any transcriptional errors that result from this process are unintentional and may not be corrected upon review.

## 2021-02-18 ENCOUNTER — Ambulatory Visit: Payer: Medicaid Other | Admitting: Adult Health

## 2021-03-21 ENCOUNTER — Ambulatory Visit
Admission: EM | Admit: 2021-03-21 | Discharge: 2021-03-21 | Disposition: A | Discharge status: Home / Self Care | Payer: Medicaid Other | Attending: Physician Assistant | Admitting: Physician Assistant

## 2021-03-21 ENCOUNTER — Encounter: Payer: Self-pay | Admitting: Emergency Medicine

## 2021-03-21 ENCOUNTER — Other Ambulatory Visit: Payer: Self-pay

## 2021-03-21 DIAGNOSIS — M26622 Arthralgia of left temporomandibular joint: Secondary | ICD-10-CM

## 2021-03-21 DIAGNOSIS — R6884 Jaw pain: Secondary | ICD-10-CM

## 2021-03-21 MED ORDER — TIZANIDINE HCL 4 MG PO TABS: 4.0000 mg | ORAL_TABLET | Freq: Three times a day (TID) | ORAL | 0 refills | Status: AC | PRN

## 2021-03-21 MED ORDER — MELOXICAM 15 MG PO TABS
15.0000 mg | ORAL_TABLET | Freq: Every day | ORAL | 0 refills | Status: AC
Start: 2021-03-21 — End: 2021-04-20

## 2021-03-21 NOTE — Discharge Instructions (Addendum)
Your symptoms are consistent with TMJ pain.  See handout.  I have sent in an anti-inflammatory medication for you to take daily.  You can also take Tylenol with this.  Sent in a muscle relaxer.  Take this in the evenings.  He can also take this during the day if you are going to be home.  Can make you drowsy so I would not take it and try to work.  If you are still having symptoms in the next couple of weeks or they worsen, follow-up with primary care provider or consider staying dentist as they may need to fit you for a mouthguard or consider other treatments.

## 2021-03-21 NOTE — ED Provider Notes (Signed)
[facility]    CSN: [id number] Arrival date & time: 03/21/21  1919      History   Chief Complaint Chief Complaint  Patient presents with   Jaw Pain    left    HPI Ethan Brewer is a 20 y.o. male presenting for approximately 1 week history of left sided jaw pain with radiation of pain to the ear over the past couple of days.  Pain is worse with chewing and opening mouth wide.  No injury to the jaw.  Admits to slightly muffled hearing but denies any drainage from the ear.  No fevers or congestion.  Patient admits to similar problems in the past but says it has resolved within a couple of hours.  He did take Goody's powder and says that helps symptoms a little bit.  He denies any dental issues.  No other complaints.  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 Authorizing Provider  meloxicam (MOBIC) 15 MG tablet Take 1 tablet (15 mg total) by mouth daily. 03/21/21 04/20/21 Yes Eusebio Friendly B, PA-C  tiZANidine (ZANAFLEX) 4 MG tablet Take 1 tablet (4 mg total) by mouth every 8 (eight) hours as needed for up to 10 days (jaw pain). 03/21/21 03/31/21 Yes Shirlee Latch, PA-C  albuterol (VENTOLIN HFA) 108 (90 Base) MCG/ACT inhaler Inhale 1-2 puffs into the lungs every 6 (six) hours as needed for wheezing or shortness of breath (Cough). 11/13/20   Tommie Sams, DO  promethazine-dextromethorphan (PROMETHAZINE-DM) 6.25-15 MG/5ML syrup Take 5 mLs by mouth 4 (four) times daily as needed for cough. 11/13/20   Tommie Sams, DO  fluticasone (FLONASE)  50 MCG/ACT nasal spray Place 1-2 sprays into both nostrils daily as needed for allergies or rhinitis.  11/03/20  [provider]  loratadine (CLARITIN) 10 MG tablet Take 10 mg by mouth daily as needed for allergies.  11/03/20  [provider]    Family History History reviewed. No pertinent family history.  Social History Social History   Tobacco Use   Smoking status: Never   Smokeless tobacco: Never  Vaping Use   Vaping Use: Never used  Substance Use Topics   Alcohol use: Never   Drug use: Never     Allergies   Prednisone   Review of Systems Review of Systems  Constitutional:  Negative for fatigue and fever.  HENT:  Positive for ear pain. Negative for congestion and ear discharge.        Left sided jaw pain  Respiratory:  Negative for cough.   Neurological:  Negative for dizziness, weakness and headaches.    Physical Exam Triage Vital Signs ED Triage Vitals  Enc Vitals Group     BP 03/21/21 1932 (!) 145/85     Pulse Rate 03/21/21 1932 62     Resp 03/21/21 1932 18     Temp 03/21/21 1932 97.8 F (36.6 C)     Temp Source 03/21/21 1932 Oral     SpO2 03/21/21 1932  98 %     Weight 03/21/21 1930 240 lb 1.3 oz (108.9 kg)     Height 03/21/21 1930 5\' 9"  (1.753 m)     Head Circumference --      Peak Flow --      Pain Score 03/21/21 1929 4     Pain Loc --      Pain Edu? --      Excl. in GC? --    No data found.  Updated Vital Signs BP (!) 145/85 (BP Location: Left Arm)   Pulse 62   Temp 97.8 F (36.6 C) (Oral)   Resp 18   Ht 5\' 9"  (1.753 m)   Wt 240 lb 1.3 oz (108.9 kg)   SpO2 98%   BMI 35.45 kg/m      Physical Exam Vitals and nursing note reviewed.  Constitutional:      General: He is not in acute distress.    Appearance: Normal appearance. He is well-developed. He is not ill-appearing.  HENT:     Head: Normocephalic and atraumatic.     Jaw: Tenderness (Left TMJ, mild click noted) and pain on movement present. No swelling.     Left  Ear: Tympanic membrane, ear canal and external ear normal.     Nose: Nose normal.     Mouth/Throat:     Mouth: Mucous membranes are moist.     Pharynx: Oropharynx is clear.     Comments: No dental tenderness, cavities, gingival swelling Eyes:     General: No scleral icterus.    Conjunctiva/sclera: Conjunctivae normal.  Cardiovascular:     Rate and Rhythm: Normal rate and regular rhythm.     Heart sounds: Normal heart sounds.  Pulmonary:     Effort: Pulmonary effort is normal. No respiratory distress.     Breath sounds: Normal breath sounds.  Musculoskeletal:     Cervical back: Neck supple.  Skin:    General: Skin is warm and dry.  Neurological:     General: No focal deficit present.     Mental Status: He is alert. Mental status is at baseline.     Motor: No weakness.     Coordination: Coordination normal.     Gait: Gait normal.  Psychiatric:        Mood and Affect: Mood normal.        Thought Content: Thought content normal.     UC Treatments / Results  Labs (all labs ordered are listed, but only abnormal results are displayed) Labs Reviewed - No data to display  EKG   Radiology No results found.  Procedures Procedures (including critical care time)  Medications Ordered in UC Medications - No data to display  Initial Impression / Assessment and Plan / UC Course  I have reviewed the triage vital signs and the nursing notes.  Pertinent labs & imaging results that were available during my care of the patient were reviewed by me and considered in my medical decision making (see chart for details).  20 year old male presenting for left sided jaw pain for the past week.  Pain worse with eating/chewing and opening mouth wide.  On exam he does have tenderness palpation of the TMJ with a mild click of the TMJ.  No jaw locking.  Exam does not reveal any signs of otitis media, effusion or cerumen impaction or otitis externa.  Advised patient symptoms consistent with TMJ  arthralgia.  Treating at this time with meloxicam.  Also sent in tizanidine as needed.  Reviewed supportive care techniques.  Advised to follow PCP or dentist if not improving over the next couple of weeks or if symptoms worsen.  To ED if jaw locks.  Final Clinical Impressions(s) / UC Diagnoses   Final diagnoses:  Arthralgia of left temporomandibular joint  Jaw pain     Discharge Instructions      Your symptoms are consistent with TMJ pain.  See handout.  I have sent in an anti-inflammatory medication for you to take daily.  You can also take Tylenol with this.  Sent in a muscle relaxer.  Take this in the evenings.  He can also take this during the day if you are going to be home.  Can make you drowsy so I would not take it and try to work.  If you are still having symptoms in the next couple of weeks or they worsen, follow-up with primary care provider or consider staying dentist as they may need to fit you for a mouthguard or consider other treatments.     ED Prescriptions     Medication Sig Dispense Auth. Provider   meloxicam (MOBIC) 15 MG tablet Take 1 tablet (15 mg total) by mouth daily. 30 tablet Eusebio Friendly B, PA-C   tiZANidine (ZANAFLEX) 4 MG tablet Take 1 tablet (4 mg total) by mouth every 8 (eight) hours as needed for up to 10 days (jaw pain). 30 tablet Gareth Morgan      PDMP not reviewed this encounter.   Shirlee Latch, PA-C 03/21/21 1952

## 2021-03-21 NOTE — ED Triage Notes (Signed)
Pt c/o left sided jaw pain. He states it is worse when he chews and the last 2 days his left ear has been hurting also when she chews.

## 2022-09-12 ENCOUNTER — Ambulatory Visit
Admission: EM | Admit: 2022-09-12 | Discharge: 2022-09-12 | Disposition: A | Discharge status: Home / Self Care | Payer: Medicaid Other | Attending: Emergency Medicine | Admitting: Emergency Medicine

## 2022-09-12 DIAGNOSIS — J069 Acute upper respiratory infection, unspecified: Secondary | ICD-10-CM

## 2022-09-12 MED ORDER — PROMETHAZINE-DM 6.25-15 MG/5ML PO SYRP: 5.0000 mL | ORAL_SOLUTION | Freq: Four times a day (QID) | ORAL | 0 refills | Status: DC | PRN

## 2022-09-12 MED ORDER — IPRATROPIUM BROMIDE 0.06 % NA SOLN: 2.0000 | Freq: Four times a day (QID) | NASAL | 12 refills | Status: DC

## 2022-09-12 MED ORDER — BENZONATATE 100 MG PO CAPS: 200.0000 mg | ORAL_CAPSULE | Freq: Three times a day (TID) | ORAL | 0 refills | Status: DC

## 2022-09-12 NOTE — Discharge Instructions (Signed)
You need to wear a mask around others for 5 days or until your symptoms improve.  Use OTC Tylenol and/or Ibuprofen as needed for body aches.  Use the Atrovent nasal spray, 2 squirts in each nostril every 6 hours, as needed for runny nose and postnasal drip.  Use the Tessalon Perles every 8 hours during the day.  Take them with a small sip of water.  They may give you some numbness to the base of your tongue or a metallic taste in your mouth, this is normal.  Use the Promethazine DM cough syrup at bedtime for cough and congestion.  It will make you drowsy so do not take it during the day.  Return for reevaluation or see your primary care provider for any new or worsening symptoms.

## 2022-09-12 NOTE — ED Triage Notes (Signed)
Pt c/o chest congestion & sinus pressure x3 days. Denies any fevers,chills or bodyaches.

## 2022-09-12 NOTE — ED Provider Notes (Signed)
[facility]    CSN: [id number] Arrival date & time: 09/12/22  1132      History   Chief Complaint Chief Complaint  Patient presents with   Chest congestion   Sinus Pressure    HPI Ethan Brewer is a 22 y.o. male.   HPI  22 year old male here for evaluation of respiratory complaints.  The patient has a significant past medical history for asthma, GERD, and tonsillectomy presenting for evaluation of 3 days worth of sinus pressure, nasal congestion, and a cough.  He reports that the cough is intermittently productive but the sputum is clear.  He denies fever, sore throat, ear pain, shortness of breath, or wheezing.  He states that his father, whom he works with, has similar symptoms but his wife and child do not have any respiratory symptoms.  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 Authorizing Provider  benzonatate (TESSALON) 100 MG capsule Take 2 capsules (200 mg total) by mouth every 8 (eight) hours. 09/12/22  Yes Margarette Canada, NP  ipratropium (ATROVENT) 0.06 % nasal spray Place 2 sprays into both nostrils 4 (four) times daily. 09/12/22  Yes Margarette Canada, NP  promethazine-dextromethorphan (PROMETHAZINE-DM) 6.25-15 MG/5ML syrup Take 5 mLs by mouth 4 (four) times daily as needed. 09/12/22  Yes Margarette Canada, NP  fluticasone [facility]) 50 MCG/ACT nasal spray Place 1-2 sprays into both nostrils daily as needed for allergies or rhinitis.  11/03/20  [provider]  loratadine (CLARITIN) 10 MG tablet Take 10 mg by mouth daily as needed  for allergies.  11/03/20  [provider]    Family History History reviewed. No pertinent family history.  Social History Social History   Tobacco Use   Smoking status: Never   Smokeless tobacco: Never  Vaping Use   Vaping Use: Never used  Substance Use Topics   Alcohol use: Never   Drug use: Never     Allergies   Prednisone   Review of Systems Review of Systems  Constitutional:  Negative for fever.  HENT:  Positive for congestion and sinus pressure. Negative for ear pain, rhinorrhea and sore throat.   Respiratory:  Positive for cough. Negative for shortness of breath and wheezing.      Physical Exam Triage Vital Signs ED Triage Vitals  Enc Vitals Group     BP 09/12/22 1159 127/78     Pulse Rate 09/12/22 1159 74     Resp 09/12/22 1159 16     Temp 09/12/22 1159 98.1 F (36.7 C)     Temp Source 09/12/22 1159 Oral     SpO2 09/12/22 1159 96 %     Weight 09/12/22 1158 255 lb (115.7 kg)     Height 09/12/22 1158 '5\' 9"'$  (1.753 m)     Head Circumference --      Peak Flow --      Pain Score 09/12/22 1158 0     Pain Loc --      Pain Edu? --  Excl. in GC? --    No data found.  Updated Vital Signs BP 127/78 (BP Location: Left Arm)   Pulse 74   Temp 98.1 F (36.7 C) (Oral)   Resp 16   Ht '5\' 9"'$  (1.753 m)   Wt 255 lb (115.7 kg)   SpO2 96%   BMI 37.66 kg/m   Visual Acuity Right Eye Distance:   Left Eye Distance:   Bilateral Distance:    Right Eye Near:   Left Eye Near:    Bilateral Near:     Physical Exam Vitals and nursing note reviewed.  Constitutional:      Appearance: Normal appearance. He is not ill-appearing.  HENT:     Head: Normocephalic and atraumatic.     Right Ear: Tympanic membrane, ear canal and external ear normal. There is no impacted cerumen.     Left Ear: Tympanic membrane, ear canal and external ear normal. There is no impacted cerumen.     Nose: Congestion and rhinorrhea present.     Comments: Haze Rushing is markedly edematous  and erythematous with milky discharge in both nares.  I am unable to visualize the turbinates due to the amount of edema in the anterior nasal passages.    Mouth/Throat:     Mouth: Mucous membranes are moist.     Pharynx: Oropharynx is clear. No oropharyngeal exudate or posterior oropharyngeal erythema.     Comments: Posterior pharynx is free of erythema, injection, or postnasal drip. Cardiovascular:     Rate and Rhythm: Normal rate and regular rhythm.     Pulses: Normal pulses.     Heart sounds: Normal heart sounds. No murmur heard.    No friction rub. No gallop.  Pulmonary:     Effort: Pulmonary effort is normal.     Breath sounds: Normal breath sounds. No wheezing, rhonchi or rales.  Musculoskeletal:     Cervical back: Normal range of motion and neck supple.  Lymphadenopathy:     Cervical: No cervical adenopathy.  Skin:    General: Skin is warm and dry.     Capillary Refill: Capillary refill takes less than 2 seconds.     Findings: No erythema or rash.  Neurological:     General: No focal deficit present.     Mental Status: He is alert and oriented to person, place, and time.      UC Treatments / Results  Labs (all labs ordered are listed, but only abnormal results are displayed) Labs Reviewed - No data to display  EKG   Radiology No results found.  Procedures Procedures (including critical care time)  Medications Ordered in UC Medications - No data to display  Initial Impression / Assessment and Plan / UC Course  I have reviewed the triage vital signs and the nursing notes.  Pertinent labs & imaging results that were available during my care of the patient were reviewed by me and considered in my medical decision making (see chart for details).   Patient is a very pleasant, nontoxic-appearing 22 year old male here for evaluation of 3 days with a respiratory symptoms as outlined in the HPI above.  The patient is not in any respiratory distress with a respiratory  rate of 16 and a room air oxygen saturation of 96%.  He is afebrile at 98.1.  The patient does have inflamed nasal mucosa with milky discharge but the remainder of his upper respiratory tree is unremarkable and he does not have any adventitious lung sounds.  His exam is  consistent with an upper respiratory infection that is most likely viral.  I do not feel that he has influenza as he has not had a fever and his symptoms have gone longer than 3 days so I will not test him.  This could possibly be COVID but given the new CDC guidelines he is not having significant symptoms so I will not test him at this time.  I have advised him to wear a mask around other people for an additional 5 days or until symptoms improve.  If he has any worsening symptoms he can return for reevaluation.  I will prescribe Atrovent nasal spray that he can use to help his nasal congestion along with Tessalon Perles and Promethazine DM cough syrup.  I also vies him to increase his oral fluid intake.  He may also use over-the-counter Mucinex to help loosen up his mucus so is easier for him to clear.  He denies any need for work note.   Final Clinical Impressions(s) / UC Diagnoses   Final diagnoses:  Viral URI with cough     Discharge Instructions      You need to wear a mask around others for 5 days or until your symptoms improve.  Use OTC Tylenol and/or Ibuprofen as needed for body aches.  Use the Atrovent nasal spray, 2 squirts in each nostril every 6 hours, as needed for runny nose and postnasal drip.  Use the Tessalon Perles every 8 hours during the day.  Take them with a small sip of water.  They may give you some numbness to the base of your tongue or a metallic taste in your mouth, this is normal.  Use the Promethazine DM cough syrup at bedtime for cough and congestion.  It will make you drowsy so do not take it during the day.  Return for reevaluation or see your primary care provider for any new or worsening  symptoms.      ED Prescriptions     Medication Sig Dispense Auth. Provider   benzonatate (TESSALON) 100 MG capsule Take 2 capsules (200 mg total) by mouth every 8 (eight) hours. 21 capsule Margarette Canada, NP   ipratropium (ATROVENT) 0.06 % nasal spray Place 2 sprays into both nostrils 4 (four) times daily. 15 mL Margarette Canada, NP   promethazine-dextromethorphan (PROMETHAZINE-DM) 6.25-15 MG/5ML syrup Take 5 mLs by mouth 4 (four) times daily as needed. 118 mL Margarette Canada, NP      PDMP not reviewed this encounter.   Margarette Canada, NP 09/12/22 1241

## 2022-09-19 ENCOUNTER — Encounter: Payer: Self-pay | Admitting: Cardiovascular Disease

## 2022-09-19 DIAGNOSIS — R002 Palpitations: Secondary | ICD-10-CM

## 2022-09-19 NOTE — Telephone Encounter (Signed)
OK to also order a 7 day zio monitor for palpitations. That way we can have the results available when he comes in. Thanks.

## 2022-09-20 ENCOUNTER — Ambulatory Visit: Payer: Medicaid Other | Attending: Cardiovascular Disease

## 2022-09-20 DIAGNOSIS — R002 Palpitations: Secondary | ICD-10-CM

## 2022-09-20 NOTE — Progress Notes (Unsigned)
Enrolled patient for a 7 day Zio XT monitor to be mailed to patients home.  

## 2022-10-16 ENCOUNTER — Ambulatory Visit: Payer: Medicaid Other | Attending: Nurse Practitioner

## 2022-10-16 ENCOUNTER — Encounter: Payer: Self-pay | Admitting: Nurse Practitioner

## 2022-10-16 ENCOUNTER — Ambulatory Visit: Payer: Medicaid Other | Attending: Physician Assistant | Admitting: Nurse Practitioner

## 2022-10-16 VITALS — BP 132/68 | HR 82 | Ht 69.0 in | Wt 254.2 lb

## 2022-10-16 DIAGNOSIS — R0789 Other chest pain: Secondary | ICD-10-CM

## 2022-10-16 DIAGNOSIS — R002 Palpitations: Secondary | ICD-10-CM

## 2022-10-16 DIAGNOSIS — R0602 Shortness of breath: Secondary | ICD-10-CM | POA: Diagnosis not present

## 2022-10-16 DIAGNOSIS — E669 Obesity, unspecified: Secondary | ICD-10-CM | POA: Diagnosis not present

## 2022-10-16 DIAGNOSIS — R42 Dizziness and giddiness: Secondary | ICD-10-CM | POA: Diagnosis not present

## 2022-10-16 NOTE — Patient Instructions (Signed)
Medication Instructions:  Your physician recommends that you continue on your current medications as directed. Please refer to the Current Medication list given to you today.  *If you need a refill on your cardiac medications before your next appointment, please call your pharmacy*   Lab Work: Your physician recommends that you complete lab work today. BMET, TSH, Magnesium   If you have labs (blood work) drawn today and your tests are completely normal, you will receive your results only by: MyChart Message (if you have MyChart) OR A paper copy in the mail If you have any lab test that is abnormal or we need to change your treatment, we will call you to review the results.   Testing/Procedures: ZIO AT Long term monitor-Live Telemetry  Your physician has requested you wear a ZIO patch monitor for 14 days.  This is a single patch monitor. [employer] supplies one patch monitor per enrollment. Additional  stickers are not available.  Please do not apply patch if you will be having a Nuclear Stress Test, Echocardiogram, Cardiac CT, MRI,  or Chest Xray during the period you would be wearing the monitor. The patch cannot be worn during  these tests. You cannot remove and re-apply the ZIO AT patch monitor.  Your ZIO patch monitor will be mailed 3 day USPS to your address on file. It may take 3-5 days to  receive your monitor after you have been enrolled.  Once you have received your monitor, please review the enclosed instructions. Your monitor has  already been registered assigning a specific monitor serial # to you.   Billing and Patient Assistance Program information  Meredeth Ide has been supplied with any insurance information on record for billing. [employer] offers a sliding scale Patient Assistance Program for patients without insurance, or whose  insurance does not completely cover the cost of the ZIO patch monitor. You must apply for the  Patient Assistance Program to qualify for the  discounted rate. To apply, call [employer] at [phone],  select option 4, select option 2 , ask to apply for the Patient Assistance Program, (you can request an  interpreter if needed). [employer] will ask your household income and how many people are in your  household. [employer] will quote your out-of-pocket cost based on this information. They will also be able  to set up a 12 month interest free payment plan if needed.  Applying the monitor   Shave hair from upper left chest.  Hold the abrader disc by orange tab. Rub the abrader in 40 strokes over left upper chest as indicated in  your monitor instructions.  Clean area with 4 enclosed alcohol pads. Use all pads to ensure the area is cleaned thoroughly. Let  dry.  Apply patch as indicated in monitor instructions. Patch will be placed under collarbone on left side of  chest with arrow pointing upward.  Rub patch adhesive wings for 2 minutes. Remove the white label marked "1". Remove the white label  marked "2". Rub patch adhesive wings for 2 additional minutes.  While looking in a mirror, press and release button in center of patch. A small green light will flash 3-4  times. This will be your only indicator that the monitor has been turned on.  Do not shower for the first 24 hours. You may shower after the first 24 hours.  Press the button if you feel a symptom. You will hear a small click. Record Date, Time and Symptom in  the Patient Log.  Starting the Gateway  In your kit there is a Audiological scientist box the size of a cellphone. This is Buyer, retail. It transmits all your  recorded data to [facility]. This box must always stay within 10 feet of you. Open the box and push the *  button. There will be a light that blinks orange and then green a few times. When the light stops  blinking, the Gateway is connected to the ZIO patch. Call [employer] at [phone] to confirm your monitor is transmitting.  Returning your monitor  Remove your patch  and place it inside the Gateway. In the lower half of the Gateway there is a white  bag with prepaid postage on it. Place Gateway in bag and seal. Mail package back to [place] as soon as  possible. Your physician should have your final report approximately 7 days after you have mailed back  your monitor. Call [facility] Customer Care at [phone] if you have questions regarding your ZIO AT  patch monitor. Call them immediately if you see an orange light blinking on your monitor.  If your monitor falls off in less than 4 days, contact our Monitor department at [phone]. If your  monitor becomes loose or falls off after 4 days call [employer] at [phone] for suggestions on  securing your monitor    Follow-Up: At [facility], you and your health needs are our priority.  As part of our continuing mission to provide you with exceptional heart care, we have created designated Provider Care Teams.  These Care Teams include your primary Cardiologist (physician) and Advanced Practice Providers (APPs -  Physician Assistants and Nurse Practitioners) who all work together to provide you with the care you need, when you need it.  We recommend signing up for the patient portal called "MyChart".  Sign up information is provided on this After Visit Summary.  MyChart is used to connect with patients for Virtual Visits (Telemedicine).  Patients are able to view lab/test results, encounter notes, upcoming appointments, etc.  Non-urgent messages can be sent to your provider as well.   To learn more about what you can do with MyChart, go to [email or web address].    Your next appointment:   6-8 weeks  Provider:   Bernadene Person, NP        Other Instructions

## 2022-10-16 NOTE — Progress Notes (Unsigned)
Enrolled for [employer] to mail a ZIO XT long term holter monitor to the patients address on file.   Dr. Croitoru to read. 

## 2022-10-16 NOTE — Progress Notes (Signed)
Office Visit    Patient Name: Ethan Brewer Date of Encounter: 10/16/2022  Primary Care Provider:  Nira Retort Primary Cardiologist:  Thurmon Fair, MD  Chief Complaint    22 year old male with a history of palpitations, dizziness, obesity, asthma, and GERD who presents for follow-up related to palpitations.  Past Medical History    Past Medical History:  Diagnosis Date   Asthma    GERD (gastroesophageal reflux disease)    Past Surgical History:  Procedure Laterality Date   TONSILLECTOMY      Allergies  Allergies  Allergen Reactions   Prednisone Palpitations    Rapid heart rate     Labs/Other Studies Reviewed    The following studies were reviewed today: Echocardiogram April 2020:  1. The left ventricle has normal systolic function with an ejection fraction of 60-65%. The cavity size was normal. Left ventricular diastolic parameters were normal.  2. The right ventricle has normal systolic function. The cavity was normal. There is no increase in right ventricular wall thickness. Normal RVSP  3. There is mild dilatation of the aortic root, 3.5 cm  4. The aortic valve is tricuspid. Event monitor May 2020: Mediocre recording quality. Only 46% interpretable (28% uninterpretable due to artifact, not worn remainder of the time). Normal sinus rhythm with sinus arrhythmia and normal circadian variation. A single PAC is seen during the entire tracing. There are no PVCs. There is no evidence of complex atrial or ventricular arrhythmia. There is no correlation between the rhythm and the patient's symptoms. Symptoms were described only sinus bradycardia, normal sinus rhythm and mild sinus tachycardia.   Normal event monitor.  There is no correlation between the reports of palpitations and the objective rhythm recorded.  Recent Labs: No results found for requested labs within last 365 days.  Recent Lipid Panel    Component Value Date/Time   CHOL 174 (H)  06/20/2019 1228   TRIG 81 06/20/2019 1228   HDL 54 06/20/2019 1228   CHOLHDL 3.2 06/20/2019 1228   LDLCALC 105 06/20/2019 1228    History of Present Illness    22 year old male with the above past medical history including palpitations, dizziness, obesity, asthma, and GERD.  He was previously evaluated by Dr. Royann Shivers in 2020 in the setting of palpitations, dizziness, family history of CAD.  Echocardiogram at that time was normal.  He wore a heart monitor that showed normal sinus rhythm.  Subjective palpitations did not correlate with any arrhythmia. He was last seen in the office on 06/20/2019 and was stable overall standpoint.  He has not been seen in follow-up since.  He contacted our office on 09/19/2022 with complaints of shortness of breath, palpitations at rest (primarily when laying down in bed at night).  7-day ZIO monitor was ordered and is pending. He was seen in the ED at [facility] on 09/30/2022 for chest pain that lasted several hours after lifting a boat.  Troponin was negative x2, chest x-ray was unremarkable.  Potassium was low.   He presents today for follow-up. Since his last visit he has been stable overall from a cardiac standpoint. He had an episode of palpitations approximately 3 weeks ago that lasted for 2 to 3 hours while he was lying in bed. He felt he could not take a deep breath at the time.  His symptoms improved with activity. He did report increased stress at the time. He has not had any palpitations since.  He has not had any chest pain since his recent ED  visit.  He notes he recently started exercising again and has noted some mild dyspnea on exertion.  Other than his recent episode of palpitations, chest pain, and dyspnea on exertion, he reports feeling well.  Home Medications    Current Outpatient Medications  Medication Sig Dispense Refill   acetaminophen (TYLENOL) 325 MG tablet Take 325 mg by mouth every 6 (six) hours as needed.     benzonatate (TESSALON) 100 MG  capsule Take 2 capsules (200 mg total) by mouth every 8 (eight) hours. (Patient not taking: Reported on 10/16/2022) 21 capsule 0   ipratropium (ATROVENT) 0.06 % nasal spray Place 2 sprays into both nostrils 4 (four) times daily. (Patient not taking: Reported on 10/16/2022) 15 mL 12   promethazine-dextromethorphan (PROMETHAZINE-DM) 6.25-15 MG/5ML syrup Take 5 mLs by mouth 4 (four) times daily as needed. (Patient not taking: Reported on 10/16/2022) 118 mL 0   No current facility-administered medications for this visit.     Review of Systems    He denies chest pain, pnd, orthopnea, n, v, dizziness, syncope, edema, weight gain, or early satiety. All other systems reviewed and are otherwise negative except as noted above.   Physical Exam    VS:  BP 132/68   Pulse 82   Ht 5\' 9"  (1.753 m)   Wt 254 lb 3.2 oz (115.3 kg)   SpO2 97%   BMI 37.54 kg/m  GEN: Well nourished, well developed, in no acute distress. HEENT: normal. Neck: Supple, no JVD, carotid bruits, or masses. Cardiac: RRR, no murmurs, rubs, or gallops. No clubbing, cyanosis, edema.  Radials/DP/PT 2+ and equal bilaterally.  Respiratory:  Respirations regular and unlabored, clear to auscultation bilaterally. GI: Soft, nontender, nondistended, BS + x 4. MS: no deformity or atrophy. Skin: warm and dry, no rash. Neuro:  Strength and sensation are intact. Psych: Normal affect.  Accessory Clinical Findings    ECG personally reviewed by me today -NSR, 82 bpm- no acute changes.   Lab Results  Component Value Date   WBC 20.6 (H) 01/08/2020   HGB 14.3 01/08/2020   HCT 42.6 01/08/2020   MCV 82.1 01/08/2020   PLT 288 01/08/2020   Lab Results  Component Value Date   CREATININE 0.94 01/07/2020   BUN 13 01/07/2020   NA 138 01/07/2020   K 3.8 01/07/2020   CL 105 01/07/2020   CO2 27 01/07/2020   Lab Results  Component Value Date   ALT 14 06/20/2019   AST 23 06/20/2019   ALKPHOS 85 06/20/2019   BILITOT 0.5 06/20/2019   Lab Results   Component Value Date   CHOL 174 (H) 06/20/2019   HDL 54 06/20/2019   LDLCALC 105 06/20/2019   TRIG 81 06/20/2019   CHOLHDL 3.2 06/20/2019    No results found for: "HGBA1C"  Assessment & Plan    1. Palpitations/dizziness: Echo was normal in 2020. Monitor in 2020 revealed NSR.  Recent episode of palpitations that lasted approximately 2 to 3 hours while resting in bed.  He denies any palpitations since, denies dizziness.  Repeat monitor was ordered, however, patient states he never received a monitor.  Will reorder 7-day ZIO.  Recent CBC, BMET overall normal- potassium was noted to be low.  Will check TSH, BMET, Mg.  Discussed ED precautions.   2. Chest pain/dyspnea on exertion: Recent ED visit in the setting of chest pain that lasted several hours after lifting a boat.  Troponin was negative, chest x-ray was unremarkable.  He denies any further chest pain.  He recently started exercising and has noted some mild dyspnea on exertion.  ZIO monitor pending as above.  If monitor unremarkable and he continues to have dyspnea on exertion, consider repeat echo.   3. Obesity: Encouraged lifestyle modifications with diet and exercise.   4. Disposition: Follow-up in 3-4 months.      Joylene GrapesEmily C Maclain Cohron, NP 10/16/2022, 5:05 PM

## 2022-10-17 LAB — BASIC METABOLIC PANEL
BUN/Creatinine Ratio: 12 (ref 9–20)
BUN: 13 mg/dL (ref 6–20)
CO2: 25 mmol/L (ref 20–29)
Calcium: 10.2 mg/dL (ref 8.7–10.2)
Chloride: 104 mmol/L (ref 96–106)
Creatinine, Ser: 1.06 mg/dL (ref 0.76–1.27)
Glucose: 86 mg/dL (ref 70–99)
Potassium: 4.7 mmol/L (ref 3.5–5.2)
Sodium: 142 mmol/L (ref 134–144)
eGFR: 101 mL/min/{1.73_m2} (ref >59)

## 2022-10-17 LAB — TSH: TSH: 1.4 u[IU]/mL (ref 0.450–4.500)

## 2022-10-17 LAB — MAGNESIUM: Magnesium: 2 mg/dL (ref 1.6–2.3)

## 2022-10-24 ENCOUNTER — Telehealth: Payer: Medicaid Other | Admitting: Family Medicine

## 2022-10-24 DIAGNOSIS — J3089 Other allergic rhinitis: Secondary | ICD-10-CM

## 2022-10-24 DIAGNOSIS — J45909 Unspecified asthma, uncomplicated: Secondary | ICD-10-CM

## 2022-10-24 MED ORDER — LORATADINE 10 MG PO TABS: 10.0000 mg | ORAL_TABLET | Freq: Every day | ORAL | 0 refills | Status: AC

## 2022-10-24 MED ORDER — FLUTICASONE PROPIONATE 50 MCG/ACT NA SUSP: 2.0000 | Freq: Every day | NASAL | 0 refills | Status: AC

## 2022-10-24 MED ORDER — ALBUTEROL SULFATE HFA 108 (90 BASE) MCG/ACT IN AERS: 2.0000 | INHALATION_SPRAY | Freq: Four times a day (QID) | RESPIRATORY_TRACT | 0 refills | Status: AC | PRN

## 2022-10-24 NOTE — Progress Notes (Signed)
Virtual Visit Consent   Ethan Brewer, you are scheduled for a virtual visit with a [facility] provider today. Just as with appointments in the office, your consent must be obtained to participate. Your consent will be active for this visit and any virtual visit you may have with one of our providers in the next 365 days. If you have a MyChart account, a copy of this consent can be sent to you electronically.  As this is a virtual visit, video technology does not allow for your provider to perform a traditional examination. This may limit your provider's ability to fully assess your condition. If your provider identifies any concerns that need to be evaluated in person or the need to arrange testing (such as labs, EKG, etc.), we will make arrangements to do so. Although advances in technology are sophisticated, we cannot ensure that it will always work on either your end or our end. If the connection with a video visit is poor, the visit may have to be switched to a telephone visit. With either a video or telephone visit, we are not always able to ensure that we have a secure connection.  By engaging in this virtual visit, you consent to the provision of healthcare and authorize for your insurance to be billed (if applicable) for the services provided during this visit. Depending on your insurance coverage, you may receive a charge related to this service.  I need to obtain your verbal consent now. Are you willing to proceed with your visit today? Ethan Brewer has provided verbal consent on 10/24/2022 for a virtual visit (video or telephone). Freddy Finner, NP  Date: 10/24/2022 1:18 PM  Virtual Visit via Video Note   I, Freddy Finner, connected with  Ethan Brewer  ([id number], October 07, 2000) on 10/24/22 at  1:15 PM EDT by a video-enabled telemedicine application and verified that I am speaking with the correct person using two identifiers.  Location: Patient: Virtual Visit Location Patient:  Home Provider: Virtual Visit Location Provider: Home Office   I discussed the limitations of evaluation and management by telemedicine and the availability of in person appointments. The patient expressed understanding and agreed to proceed.    History of Present Illness: Ethan Brewer is a 22 y.o. who identifies as a male who was assigned male at birth, and is being seen today for allergies  Onset was off and on again- having some windedness for about a week Associated symptoms are clear mucus, nasal pressure, some mild soreness in lower throat. 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  Observations/Objective: Patient is well-developed, well-nourished in no acute distress.  Resting comfortably  at home.  Head is normocephalic, atraumatic.  No labored breathing.  Speech is clear and coherent with logical content.  Patient is alert and oriented at baseline.    Assessment and Plan:  1. Environmental and seasonal allergies  - albuterol (VENTOLIN HFA) 108 (90 Base) MCG/ACT inhaler; Inhale 2 puffs into the lungs every 6 (six) hours as needed for wheezing or shortness of breath.  Dispense: 8 g; Refill: 0 - loratadine (CLARITIN) 10 MG tablet; Take 1 tablet (10 mg total) by mouth daily.  Dispense: 30 tablet; Refill: 0 - fluticasone (FLONASE) 50 MCG/ACT nasal spray; Place 2 sprays into both nostrils daily.  Dispense: 16 g; Refill: 0  2. Uncomplicated asthma, unspecified asthma severity, unspecified whether persistent  - albuterol  (VENTOLIN HFA) 108 (90 Base) MCG/ACT inhaler; Inhale 2 puffs into the lungs every 6 (six) hours as needed for wheezing or shortness of breath.  Dispense: 8 g; Refill: 0   -needs to follow up with PCP for asthma on going treatment needs -follow up in person is symptoms worsen -review of ALL t prednisone (vs intolerance- rapid HR) and use of Flonase- will try this time and see if he tolerates it-   Reviewed side effects, risks and benefits of medication.    Patient acknowledged agreement and understanding of the plan.   Past Medical, Surgical, Social History, Allergies, and Medications have been Reviewed.      Follow Up Instructions: I discussed the assessment and treatment plan with the patient. The patient was provided an opportunity to ask questions and all were answered. The patient agreed with the plan and demonstrated an understanding of the instructions.  A copy of instructions were sent to the patient via MyChart unless otherwise noted below.    The patient was advised to call back or seek an in-person evaluation if the symptoms worsen or if the condition fails to improve as anticipated.  Time:  I spent 10 minutes with the patient via telehealth technology discussing the above problems/concerns.    Freddy Finner, NP

## 2022-10-24 NOTE — Patient Instructions (Signed)
Ethan Brewer, thank you for joining Freddy Finner, NP for today's virtual visit.  While this provider is not your primary care provider (PCP), if your PCP is located in our provider database this encounter information will be shared with them immediately following your visit.   A [facility] MyChart account gives you access to today's visit and all your visits, tests, and labs performed at [facility] " click here if you don't have a [facility] MyChart account or go to [email or web address]  Consent: (Patient) Ethan Brewer provided verbal consent for this virtual visit at the beginning of the encounter.  Current Medications:  Current Outpatient Medications:    albuterol (VENTOLIN HFA) 108 (90 Base) MCG/ACT inhaler, Inhale 2 puffs into the lungs every 6 (six) hours as needed for wheezing or shortness of breath., Disp: 8 g, Rfl: 0   fluticasone (FLONASE) 50 MCG/ACT nasal spray, Place 2 sprays into both nostrils daily., Disp: 16 g, Rfl: 0   loratadine (CLARITIN) 10 MG tablet, Take 1 tablet (10 mg total) by mouth daily., Disp: 30 tablet, Rfl: 0   Medications ordered in this encounter:  Meds ordered this encounter  Medications   albuterol (VENTOLIN HFA) 108 (90 Base) MCG/ACT inhaler    Sig: Inhale 2 puffs into the lungs every 6 (six) hours as needed for wheezing or shortness of breath.    Dispense:  8 g    Refill:  0    Order Specific Question:   Supervising Provider    Answer:   Merrilee Jansky [[id number]]   loratadine (CLARITIN) 10 MG tablet    Sig: Take 1 tablet (10 mg total) by mouth daily.    Dispense:  30 tablet    Refill:  0    Order Specific Question:   Supervising Provider    Answer:   Merrilee Jansky [[id number]]   fluticasone (FLONASE) 50 MCG/ACT nasal spray    Sig: Place 2 sprays into both nostrils daily.    Dispense:  16 g    Refill:  0    Order Specific Question:   Supervising Provider    Answer:   Merrilee Jansky [id number]     *If you need  refills on other medications prior to your next appointment, please contact your pharmacy*  Follow-Up: Call back or seek an in-person evaluation if the symptoms worsen or if the condition fails to improve as anticipated.  [facility] [phone]  Other Instructions  Follow up with a PCP so you can get on going asthma and allergy care  Allergies can cause a lot of symptoms: watery, itching eyes, runny nose (clear), sneezing, sinus pressure, and headaches. This is not making you contagious to others. You can not spread to others or catch this from others. These symptoms happen after you have been exposed to something that you are allergic to an allergen. Prevention: The best prevention is to avoid the things that you know you are allergic to, for example smoke (cigarette, cigar, wood); pollens and molds; animal dander; dust mites. And indoor inhalants such as cleaning products or aerosol sprays.  Target your bedroom as allergy free by removing carpets, damp mopping floors weekly, hanging washable curtains instead of blinds, removing books and stuffed animals, using foam pillows, and encasing pillows and mattress in plastic. Do not blow your nose too frequently or too hard. It may cause your eardrum to perforate (tear). Blow through both nostrils at the same time to equalize pressure.  Use tissue  when you blow your nose. Dispose of them and then wash your hands. If no tissue is available, do the "elbow sneeze" into the bend of your arm (away from your open hands). Always wash your hands.  If able use the [facility] in the house and car to reduce exposure to pollens. Use an air filtration system in your house or buy a small one for your bedroom. Dust your house often, using a cloth and cleaner or polish that keeps the dust from flying into the air. Allergy testing can be done if you have had allergies for a long time and are not doing well on current treatments. Take your medications as directed.  If you find the medications are not working let your healthcare provider know. It might take more than one medication to control allergies, especially, seasonal ones.     If you have been instructed to have an in-person evaluation today at a local Urgent Care facility, please use the link below. It will take you to a list of all of our available [facility], including address, phone number and hours of operation. Please do not delay care.  [facility]  If you or a family member do not have a primary care provider, use the link below to schedule a visit and establish care. When you choose a [facility] primary care physician or advanced practice provider, you gain a long-term partner in health. Find a Primary Care Provider  Learn more about [facility]'s in-office and virtual care options: [place] - Get Care Now

## 2022-10-25 ENCOUNTER — Encounter: Payer: Self-pay | Admitting: Cardiovascular Disease

## 2022-10-27 ENCOUNTER — Encounter: Payer: Self-pay | Admitting: Family Medicine

## 2022-11-03 ENCOUNTER — Other Ambulatory Visit: Payer: Self-pay | Admitting: Cardiovascular Disease

## 2022-11-03 ENCOUNTER — Ambulatory Visit: Payer: Medicaid Other | Attending: Cardiovascular Disease

## 2022-11-03 DIAGNOSIS — R002 Palpitations: Secondary | ICD-10-CM

## 2022-11-03 NOTE — Progress Notes (Unsigned)
Enrolled for [employer] to mail a ZIO XT long term holter monitor to the patients address on file.  

## 2022-11-08 ENCOUNTER — Telehealth: Payer: Medicaid Other | Admitting: Physician Assistant

## 2022-11-08 ENCOUNTER — Ambulatory Visit: Payer: Medicaid Other | Admitting: Physician Assistant

## 2022-11-08 DIAGNOSIS — K047 Periapical abscess without sinus: Secondary | ICD-10-CM | POA: Diagnosis not present

## 2022-11-08 MED ORDER — NAPROXEN 500 MG PO TABS: 500.0000 mg | ORAL_TABLET | Freq: Two times a day (BID) | ORAL | 0 refills | Status: DC

## 2022-11-08 MED ORDER — AMOXICILLIN-POT CLAVULANATE 875-125 MG PO TABS: 1.0000 | ORAL_TABLET | Freq: Two times a day (BID) | ORAL | 0 refills | Status: AC

## 2022-11-08 NOTE — Progress Notes (Signed)
E-Visit for Dental Pain  We are sorry that you are not feeling well.  Here is how we plan to help!  Based on what you have shared with me in the questionnaire, it sounds like you have dental pain from a broken tooth and possible secondary infection. I have prescribed Augmentin 875-125mg  twice a day for 7 days and Naprosyn 500mg  2 times a day for 7 days for discomfort  It is imperative that you see a dentist within 10 days of this eVisit to determine the cause of the dental pain and be sure it is adequately treated  A toothache or tooth pain is caused when the nerve in the root of a tooth or surrounding a tooth is irritated. Dental (tooth) infection, decay, injury, or loss of a tooth are the most common causes of dental pain. Pain may also occur after an extraction (tooth is pulled out). Pain sometimes originates from other areas and radiates to the jaw, thus appearing to be tooth pain.Bacteria growing inside your mouth can contribute to gum disease and dental decay, both of which can cause pain. A toothache occurs from inflammation of the central portion of the tooth called pulp. The pulp contains nerve endings that are very sensitive to pain. Inflammation to the pulp or pulpitis may be caused by dental cavities, trauma, and infection.    HOME CARE:   For toothaches: Over-the-counter pain medications such as acetaminophen or ibuprofen may be used. Take these as directed on the package while you arrange for a dental appointment. Avoid very cold or hot foods, because they may make the pain worse. You may get relief from biting on a cotton ball soaked in oil of cloves. You can get oil of cloves at most drug stores.  For jaw pain:  Aspirin may be helpful for problems in the joint of the jaw in adults. If pain happens every time you open your mouth widely, the temporomandibular joint (TMJ) may be the source of the pain. Yawning or taking a large bite of food may worsen the pain. An appointment with  your doctor or dentist will help you find the cause.     GET HELP RIGHT AWAY IF:  You have a high fever or chills If you have had a recent head or face injury and develop headache, light headedness, nausea, vomiting, or other symptoms that concern you after an injury to your face or mouth, you could have a more serious injury in addition to your dental injury. A facial rash associated with a toothache: This condition may improve with medication. Contact your doctor for them to decide what is appropriate. Any jaw pain occurring with chest pain: Although jaw pain is most commonly caused by dental disease, it is sometimes referred pain from other areas. People with heart disease, especially people who have had stents placed, people with diabetes, or those who have had heart surgery may have jaw pain as a symptom of heart attack or angina. If your jaw or tooth pain is associated with lightheadedness, sweating, or shortness of breath, you should see a doctor as soon as possible. Trouble swallowing or excessive pain or bleeding from gums: If you have a history of a weakened immune system, diabetes, or steroid use, you may be more susceptible to infections. Infections can often be more severe and extensive or caused by unusual organisms. Dental and gum infections in people with these conditions may require more aggressive treatment. An abscess may need draining or IV antibiotics, for example.  MAKE SURE YOU   Understand these instructions. Will watch your condition. Will get help right away if you are not doing well or get worse.  Thank you for choosing an e-visit.  Your e-visit answers were reviewed by a board certified advanced clinical practitioner to complete your personal care plan. Depending upon the condition, your plan could have included both over the counter or prescription medications.  Please review your pharmacy choice. Make sure the pharmacy is open so you can pick up prescription now.  If there is a problem, you may contact your provider through [employer] and have the prescription routed to another pharmacy.  Your safety is important to [place]. If you have drug allergies check your prescription carefully.   For the next 24 hours you can use MyChart to ask questions about today's visit, request a non-urgent call back, or ask for a work or school excuse. You will get an email in the next two days asking about your experience. I hope that your e-visit has been valuable and will speed your recovery.

## 2022-11-08 NOTE — Progress Notes (Signed)
I have spent 5 minutes in review of e-visit questionnaire, review and updating patient chart, medical decision making and response to patient.   Mckaylee Dimalanta Cody Karandeep Resende, PA-C    

## 2022-12-04 ENCOUNTER — Other Ambulatory Visit: Payer: Self-pay

## 2022-12-04 ENCOUNTER — Emergency Department
Admission: EM | Admit: 2022-12-04 | Discharge: 2022-12-05 | Disposition: A | Discharge status: Home / Self Care | Payer: Medicaid Other | Attending: Emergency Medicine | Admitting: Emergency Medicine

## 2022-12-04 ENCOUNTER — Encounter: Payer: Self-pay | Admitting: Emergency Medicine

## 2022-12-04 DIAGNOSIS — H9202 Otalgia, left ear: Secondary | ICD-10-CM | POA: Insufficient documentation

## 2022-12-04 DIAGNOSIS — H6122 Impacted cerumen, left ear: Secondary | ICD-10-CM | POA: Insufficient documentation

## 2022-12-04 DIAGNOSIS — J45909 Unspecified asthma, uncomplicated: Secondary | ICD-10-CM | POA: Diagnosis not present

## 2022-12-04 DIAGNOSIS — R6884 Jaw pain: Secondary | ICD-10-CM | POA: Insufficient documentation

## 2022-12-04 NOTE — ED Triage Notes (Signed)
Patient ambulatory to triage with steady gait, without difficulty or distress noted; pt reports for last several days having pain to left jaw and ear with pressure to ear; denies any recent illness

## 2022-12-04 NOTE — ED Provider Notes (Signed)
[facility] Provider Note    Event Date/Time   First MD Initiated Contact with Patient 12/04/22 2355     (approximate)   History   Jaw Pain   HPI  Ethan Brewer is a 22 y.o. male with a history of asthma and GERD who presents with left ear pain over the last week associated with sensation of the ear being stopped up very congested.  The pain sometimes radiates down to the left side of his jaw and occasionally over to the right side.  He denies any change in his hearing.  He has a mild sore throat but no difficulty swallowing.  He has no shortness of breath, cough, or fever.  He did have dental pain few weeks ago and was started on a course of Augmentin which she is still finishing up, although he states that this pain has resolved.  I reviewed the past medical records.  The patient's most recent outpatient encounter was on 5/1 with primary care for toothache and was prescribed Augmentin and Naprosyn.   Physical Exam   Triage Vital Signs: ED Triage Vitals  Enc Vitals Group     BP 12/04/22 2310 (!) 130/90     Pulse Rate 12/04/22 2310 63     Resp 12/04/22 2310 18     Temp 12/04/22 2310 97.8 F (36.6 C)     Temp Source 12/04/22 2310 Oral     SpO2 12/04/22 2310 99 %     Weight 12/04/22 2305 250 lb (113.4 kg)     Height 12/04/22 2305 5\' 9"  (1.753 m)     Head Circumference --      Peak Flow --      Pain Score 12/04/22 2305 7     Pain Loc --      Pain Edu? --      Excl. in GC? --     Most recent vital signs: Vitals:   12/04/22 2310  BP: (!) 130/90  Pulse: 63  Resp: 18  Temp: 97.8 F (36.6 C)  SpO2: 99%     General: Awake, no distress.  CV:  Good peripheral perfusion.  Resp:  Normal effort.  Abd:  No distention.  Other:  Left TM partially obscured due to cerumen.  No erythema or swelling.  Right internal ear and TM appears normal.  Oropharynx clear with no erythema or exudate.  Normal voice.  No significant palpable lymphadenopathy.  No  salivary gland swelling or tenderness.   ED Results / Procedures / Treatments   Labs (all labs ordered are listed, but only abnormal results are displayed) Labs Reviewed - No data to display   EKG     RADIOLOGY   PROCEDURES:  Critical Care performed: No  Procedures   MEDICATIONS ORDERED IN ED: Medications - No data to display   IMPRESSION / MDM / ASSESSMENT AND PLAN / ED COURSE  I reviewed the triage vital signs and the nursing notes.   Differential diagnosis includes, but is not limited to, earwax, nonspecific congestion, viral URI, TMJ.  There is no evidence of ear infection.  There is no evidence of sialoadenitis.  We will treat symptomatically with decongestants and NSAIDs.  I counseled the patient on the results of my exam.  There is no indication for further ED workup.  He is stable for discharge home with outpatient follow-up.  Return precautions given, and he expresses understanding.  Patient's presentation is most consistent with acute, uncomplicated illness.   FINAL CLINICAL  IMPRESSION(S) / ED DIAGNOSES   Final diagnoses:  None     Rx / DC Orders   ED Discharge Orders     None        Note:  This document was prepared using Dragon voice recognition software and may include unintentional dictation errors.   Dionne Bucy, MD 12/05/22 [phone]

## 2022-12-05 MED ORDER — PSEUDOEPHEDRINE HCL 60 MG PO TABS: 60.0000 mg | ORAL_TABLET | Freq: Four times a day (QID) | ORAL | 0 refills | Status: AC | PRN

## 2022-12-05 NOTE — Discharge Instructions (Signed)
Take the pseudoephedrine as prescribed up to every 6 hours over the next several days.  You may take ibuprofen or naproxen as needed for pain.  Return to the ER for any new or worsening symptoms that concern you.  You may also follow-up with ENT.  Referral information has been provided.

## 2022-12-07 ENCOUNTER — Ambulatory Visit: Payer: Medicaid Other | Attending: Nurse Practitioner | Admitting: Nurse Practitioner

## 2022-12-07 NOTE — Progress Notes (Deleted)
Office Visit    Patient Name: Ethan Brewer Date of Encounter: 12/07/2022  Primary Care Provider:  Nira Retort Primary Cardiologist:  Thurmon Fair, MD  Chief Complaint    22 year old male with a history of palpitations, dizziness, obesity, asthma, and GERD who presents for follow-up related to palpitations.   Past Medical History    Past Medical History:  Diagnosis Date   Asthma    GERD (gastroesophageal reflux disease)    Past Surgical History:  Procedure Laterality Date   TONSILLECTOMY      Allergies  Allergies  Allergen Reactions   Prednisone Palpitations    Rapid heart rate     Labs/Other Studies Reviewed    The following studies were reviewed today:  Cardiac Studies & Procedures       ECHOCARDIOGRAM  ECHOCARDIOGRAM COMPLETE 10/09/2018  Narrative ECHOCARDIOGRAM REPORT    Patient Name:   Ethan Brewer Date of Exam: 10/09/2018 Medical Rec #:  [id number]        Height:       69.0 in Accession #:    [id number]       Weight:       257.0 lb Date of Birth:  Mar 27, 2001        BSA:          2.30 m Patient Age:    18 years         BP:           138/78 mmHg Patient Gender: M                HR:           66 bpm. Exam Location:  [place]   Procedure: 2D Echo, Cardiac Doppler and Color Doppler  Indications:     ; R00.1 Bradycardia, unspecified; R00.2 Palpitations; R00.0 Tachycardia; R07.89 Other chest pain; R94.31 Non-specific ST-T changes  History:         Patient has no prior history of Echocardiogram examinations. GERD RBBB and Non-specific ST-T wave changes Signs/Symptoms: Chest Pain and Dyspnea Risk Factors: Non-Smoker and Family History of Coronary Artery Disease.  Sonographer:     Quentin Ore RDMS, RVT, RDCS Referring Phys:  [phone] [facility] Diagnosing Phys: Julien Nordmann MD  IMPRESSIONS   1. The left ventricle has normal systolic function with an ejection fraction of 60-65%. The cavity size was normal. Left ventricular  diastolic parameters were normal. 2. The right ventricle has normal systolic function. The cavity was normal. There is no increase in right ventricular wall thickness. Normal RVSP 3. There is mild dilatation of the aortic root, 3.5 cm 4. The aortic valve is tricuspid.  FINDINGS Left Ventricle: The left ventricle has normal systolic function, with an ejection fraction of 60-65%. The cavity size was normal. There is no increase in left ventricular wall thickness. Left ventricular diastolic parameters were normal. Right Ventricle: The right ventricle has normal systolic function. The cavity was normal. There is no increase in right ventricular wall thickness. Left Atrium: left atrial size was normal in size Right Atrium: right atrial size was normal in size. Right atrial pressure is estimated at 10 mmHg. Interatrial Septum: No atrial level shunt detected by color flow Doppler. Pericardium: There is no evidence of pericardial effusion. Mitral Valve: The mitral valve is normal in structure. Mitral valve regurgitation is trivial by color flow Doppler. Tricuspid Valve: The tricuspid valve is normal in structure. Tricuspid valve regurgitation is mild by color flow Doppler. Aortic Valve: The aortic valve is tricuspid Aortic  valve regurgitation was not visualized by color flow Doppler. Pulmonic Valve: The pulmonic valve was normal in structure. Pulmonic valve regurgitation was not assessed by color flow Doppler. Aorta: There is dilatation of the aortic root. Venous: The inferior vena cava measures 1.00 cm, is normal in size with greater than 50% respiratory variability.  LEFT VENTRICLE PLAX 2D LVIDd:         4.90 cm       Diastology LVIDs:         2.60 cm       LV e' lateral:   11.50 cm/s LV PW:         1.10 cm       LV E/e' lateral: 7.6 LV IVS:        1.10 cm       LV e' medial:    9.68 cm/s LVOT diam:     2.30 cm       LV E/e' medial:  9.0 LV SV:         88 ml LV SV Index:   36.28 LVOT Area:      4.15 cm  LV Volumes (MOD) LV area d, A2C:    38.20 cm LV area d, A4C:    38.20 cm LV area s, A2C:    21.70 cm LV area s, A4C:    20.60 cm LV major d, A2C:   9.42 cm LV major d, A4C:   8.73 cm LV major s, A2C:   7.48 cm LV major s, A4C:   7.39 cm LV vol d, MOD A2C: 133.0 ml LV vol d, MOD A4C: 138.0 ml LV vol s, MOD A2C: 56.0 ml LV vol s, MOD A4C: 51.3 ml LV SV MOD A2C:     77.0 ml LV SV MOD A4C:     138.0 ml LV SV MOD BP:      86.3 ml  RIGHT VENTRICLE RV S prime:     12.30 cm/s TAPSE (M-mode): 2.7 cm  LEFT ATRIUM             Index       RIGHT ATRIUM           Index LA diam:        3.70 cm 1.61 cm/m  RA Pressure: 10 mmHg LA Vol (A2C):   71.3 ml 31.02 ml/m RA Area:     16.20 cm LA Vol (A4C):   46.7 ml 20.32 ml/m RA Volume:   43.10 ml  18.75 ml/m LA Biplane Vol: 58.0 ml 25.24 ml/m AORTIC VALVE                    PULMONIC VALVE AV Area (Vmean):   3.59 cm     PV Vmax:       1.05 m/s AV Area (VTI):     3.80 cm     PV Peak grad:  4.4 mmHg AV Vmean:          103.000 cm/s AV VTI:            0.296 m AV Mean Grad:      5.0 mmHg LVOT Vmean:        89.100 cm/s LVOT VTI:          0.271 m LVOT/AV VTI ratio: 0.92  AORTA Ao Root diam: 3.50 cm Ao Asc diam:  3.00 cm Ao Arch diam: 2.5 cm  MITRAL VALVE MV Area (PHT): 3.31 cm   SHUNTS MV PHT:  66.41 msec Systemic VTI:  0.27 m MV Decel Time: 229 msec   Systemic Diam: 2.30 cm MV E velocity: 87.30 cm/s MV A velocity: 49.00 cm/s MV E/A ratio:  1.78  IVC IVC diam: 1.00 cm   Julien Nordmann MD Electronically signed by Julien Nordmann MD Signature Date/Time: 10/09/2018/2:27:12 PM    Final    MONITORS  CARDIAC EVENT MONITOR 11/12/2018  Narrative  Mediocre recording quality. Only 46% interpretable (28% uninterpretable due to artifact, not worn remainder of the time).  Normal sinus rhythm with sinus arrhythmia and normal circadian variation.  A single PAC is seen during the entire tracing. There are no PVCs.  There is no evidence of complex atrial or ventricular arrhythmia.  There is no correlation between the rhythm and the patient's symptoms. Symptoms were described only sinus bradycardia, normal sinus rhythm and mild sinus tachycardia.  Normal event monitor.  There is no correlation between the reports of palpitations and the objective rhythm recorded.          Recent Labs: 10/16/2022: BUN 13; Creatinine, Ser 1.06; Magnesium 2.0; Potassium 4.7; Sodium 142; TSH 1.400  Recent Lipid Panel    Component Value Date/Time   CHOL 174 (H) 06/20/2019 1228   TRIG 81 06/20/2019 1228   HDL 54 06/20/2019 1228   CHOLHDL 3.2 06/20/2019 1228   LDLCALC 105 06/20/2019 1228    History of Present Illness    22 year old male with the above past medical history including palpitations, dizziness, obesity, asthma, and GERD.   He was previously evaluated by Dr. Royann Shivers in 2020 in the setting of palpitations, dizziness, family history of CAD.  Echocardiogram at that time was normal.  He wore a heart monitor that showed normal sinus rhythm.  Subjective palpitations did not correlate with any arrhythmia.  He contacted our office on 09/19/2022 with complaints of shortness of breath, palpitations at rest (primarily when laying down in bed at night).  7-day ZIO monitor was ordered and is pending. He was seen in the ED at [facility] on 09/30/2022 for chest pain that lasted several hours after lifting a boat.  Troponin was negative x2, chest x-ray was unremarkable.  Potassium was low.  He was last seen in the office on 10/16/2022 and noted an episode of palpitations that lasted for approximately 2 to 3 hours while he was lying in bed at night.  He also reported increased stress at the time. He denied any recurrent chest pain though he did note mild dyspnea on exertion.  7-day ZIO was reordered, results still pending.   He presents today for follow-up. Since his last visit he has  1. Palpitations/dizziness: Echo was normal in 2020.  Monitor in 2020 revealed NSR.  Recent episode of palpitations that lasted approximately 2 to 3 hours while resting in bed.  He denies any palpitations since, denies dizziness.  Repeat monitor was ordered, however, patient states he never received a monitor.  Will reorder 7-day ZIO.  Recent CBC, BMET overall normal- potassium was noted to be low.  Will check TSH, BMET, Mg.  Discussed ED precautions.    2. Chest pain/dyspnea on exertion: Recent ED visit in the setting of chest pain that lasted several hours after lifting a boat.  Troponin was negative, chest x-ray was unremarkable.  He denies any further chest pain.  He recently started exercising and has noted some mild dyspnea on exertion.  ZIO monitor pending as above.  If monitor unremarkable and he continues to have dyspnea on exertion, consider repeat  echo.    3. Obesity: Encouraged lifestyle modifications with diet and exercise.    4. Disposition: Follow-up in   Home Medications    Current Outpatient Medications  Medication Sig Dispense Refill   albuterol (VENTOLIN HFA) 108 (90 Base) MCG/ACT inhaler Inhale 2 puffs into the lungs every 6 (six) hours as needed for wheezing or shortness of breath. 8 g 0   amoxicillin-clavulanate (AUGMENTIN) 875-125 MG tablet Take 1 tablet by mouth 2 (two) times daily. 14 tablet 0   fluticasone (FLONASE) 50 MCG/ACT nasal spray Place 2 sprays into both nostrils daily. 16 g 0   loratadine (CLARITIN) 10 MG tablet Take 1 tablet (10 mg total) by mouth daily. 30 tablet 0   naproxen (NAPROSYN) 500 MG tablet Take 1 tablet (500 mg total) by mouth 2 (two) times daily with a meal. 30 tablet 0   pseudoephedrine (SUDAFED) 60 MG tablet Take 1 tablet (60 mg total) by mouth every 6 (six) hours as needed for congestion. 24 tablet 0   No current facility-administered medications for this visit.     Review of Systems    ***.  All other systems reviewed and are otherwise negative except as noted above.    Physical Exam     VS:  There were no vitals taken for this visit. , BMI There is no height or weight on file to calculate BMI.     GEN: Well nourished, well developed, in no acute distress. HEENT: normal. Neck: Supple, no JVD, carotid bruits, or masses. Cardiac: RRR, no murmurs, rubs, or gallops. No clubbing, cyanosis, edema.  Radials/DP/PT 2+ and equal bilaterally.  Respiratory:  Respirations regular and unlabored, clear to auscultation bilaterally. GI: Soft, nontender, nondistended, BS + x 4. MS: no deformity or atrophy. Skin: warm and dry, no rash. Neuro:  Strength and sensation are intact. Psych: Normal affect.  Accessory Clinical Findings    ECG personally reviewed by me today - *** - no acute changes.   Lab Results  Component Value Date   WBC 20.6 (H) 01/08/2020   HGB 14.3 01/08/2020   HCT 42.6 01/08/2020   MCV 82.1 01/08/2020   PLT 288 01/08/2020   Lab Results  Component Value Date   CREATININE 1.06 10/16/2022   BUN 13 10/16/2022   NA 142 10/16/2022   K 4.7 10/16/2022   CL 104 10/16/2022   CO2 25 10/16/2022   Lab Results  Component Value Date   ALT 14 06/20/2019   AST 23 06/20/2019   ALKPHOS 85 06/20/2019   BILITOT 0.5 06/20/2019   Lab Results  Component Value Date   CHOL 174 (H) 06/20/2019   HDL 54 06/20/2019   LDLCALC 105 06/20/2019   TRIG 81 06/20/2019   CHOLHDL 3.2 06/20/2019    No results found for: "HGBA1C"  Assessment & Plan    1.  ***  No BP recorded.  {Refresh Note OR Click here to enter BP  :1}***   Joylene Grapes, NP 12/07/2022, 5:42 AM

## 2022-12-28 ENCOUNTER — Ambulatory Visit: Payer: Medicaid Other | Admitting: Family Medicine

## 2022-12-28 ENCOUNTER — Encounter: Payer: Self-pay | Admitting: Family Medicine

## 2022-12-28 DIAGNOSIS — Z113 Encounter for screening for infections with a predominantly sexual mode of transmission: Secondary | ICD-10-CM | POA: Diagnosis not present

## 2022-12-28 DIAGNOSIS — A539 Syphilis, unspecified: Secondary | ICD-10-CM

## 2022-12-28 LAB — HM HIV SCREENING LAB: HM HIV Screening: NEGATIVE

## 2022-12-28 MED ORDER — PENICILLIN G BENZATHINE 1200000 UNIT/2ML IM SUSY
2.4000 10*6.[IU] | PREFILLED_SYRINGE | INTRAMUSCULAR | Status: AC
Start: 2022-12-28 — End: 2022-12-28
  Administered 2022-12-28: 2.4 10*6.[IU] via INTRAMUSCULAR

## 2022-12-28 NOTE — Progress Notes (Signed)
[facility] Department STI clinic/screening visit  Subjective:  Ethan Brewer is a 22 y.o. male being seen today for an STI screening visit. The patient reports they do not have symptoms.    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 Only reports that Ethan Brewer has a HA today. Reports Ethan Brewer took amoxicillin about 4 days ago for a tooth ache  Last HIV test per patient/review of record was No results found for: "HMHIVSCREEN"  Lab Results  Component Value Date   HIV Non Reactive 01/07/2020    Does the patient or their partner desires a pregnancy in the next year? No  Screening for MPX risk: Does the patient have an unexplained rash? No Is the patient MSM? No Does the patient endorse multiple sex partners or anonymous sex partners? No Did the patient have close or sexual contact with a person diagnosed with MPX? No Has the patient traveled outside the [place] where MPX is endemic? No Is there a high clinical suspicion for MPX-- evidenced by one of the following No  -Unlikely to be chickenpox  -Lymphadenopathy  -Rash that present in same phase of evolution on any given body part   See flowsheet for further details and programmatic requirements.    There is no immunization history on file for this patient.   The following portions of the patient's history were reviewed and updated as appropriate: allergies, current medications, past medical history, past social history, past surgical history and problem list.  Objective:  There were no vitals filed for this visit.  Physical Exam Vitals and nursing note  reviewed.  Constitutional:      Appearance: Normal appearance.  HENT:     Head: Normocephalic and atraumatic.     Mouth/Throat:     Mouth: Mucous membranes are moist.     Pharynx: No oropharyngeal exudate or posterior oropharyngeal erythema.  Eyes:     General:        Right eye: No discharge.        Left eye: No discharge.     Conjunctiva/sclera:     Right eye: Right conjunctiva is not injected. No exudate.    Left eye: Left conjunctiva is not injected. No exudate. Pulmonary:     Effort: Pulmonary effort is normal.  Abdominal:     General: Abdomen is flat.     Palpations: Abdomen is soft. There is no hepatomegaly or mass.     Tenderness: There is no abdominal tenderness. There is no rebound.  Genitourinary:    Comments: Declined genital exam- asymptomatic Lymphadenopathy:     Cervical: No cervical adenopathy.     Upper Body:     Right upper body: No supraclavicular or axillary adenopathy.     Left upper body: No supraclavicular or axillary adenopathy.  Skin:    General: Skin is warm and dry.  Neurological:     Mental Status: Ethan Brewer is alert and oriented to person, place, and time.       Assessment and Plan:  Aikeem Barlet is a 22 y.o. male presenting to the [facility] Department for STI screening  1. Screening for venereal disease  - Chlamydia/GC NAA, Confirmation - HIV [place] [facility] - Syphilis Serology, [place] [facility]   Patient does not have STI symptoms Patient accepted all screenings including  urine GC/Chlamydia, and blood work for HIV/Syphilis. Patient meets criteria for HepB screening? No. Ordered? not applicable Patient meets criteria for HepC screening? No. Ordered? not applicable Recommended condom use with all sex Discussed importance of condom use for STI prevent  Treat positive test results per standing order. Discussed time line for [facility] results and that patient will be called with positive results and encouraged patient to call if Ethan Brewer  had not heard in 2 weeks Recommended repeat testing in 3 months with positive results. Recommended returning for continued or worsening symptoms.   Return if symptoms worsen or fail to improve, for STI screening.  No future appointments. Total time spent 15 minutes  Lenice Llamas, [place]

## 2022-12-28 NOTE — Progress Notes (Signed)
Pt is here for STD screening and treatment of Syphilis.  Bicillin 2.4 MU given IM without any complications. Pt monitored for 15 minutes. Condoms declined.  Berdie Ogren, RN

## 2023-01-01 LAB — CHLAMYDIA/GC NAA, CONFIRMATION
Chlamydia trachomatis, NAA: NEGATIVE
Neisseria gonorrhoeae, NAA: NEGATIVE

## 2023-01-04 ENCOUNTER — Telehealth: Payer: Self-pay

## 2023-01-04 NOTE — Telephone Encounter (Signed)
Phone call to pt re RPR result from 12/28/22 specimen: RPR= Reactive Titer= 1:16 TP= Reactive  Bicillin administered 12/28/22. Per order by Lenice Llamas, FNP, no further treatment is needed.  (Hx: 09/27/22 Titer 1:128)

## 2023-01-04 NOTE — Telephone Encounter (Signed)
Phone call to pt at [phone]. Pt confirmed password from last visit. Pt counseled re result and provider order that no further tx needed. Further counseled to follow-up yearly. Pt expressed understanding.

## 2023-01-22 ENCOUNTER — Telehealth: Payer: Self-pay | Admitting: Family Medicine

## 2023-01-22 NOTE — Telephone Encounter (Signed)
Pt has questions regarding the wait time for his results for treatment purposes. Please call him back, thanks.

## 2023-02-05 ENCOUNTER — Ambulatory Visit: Payer: Medicaid Other

## 2024-07-30 ENCOUNTER — Ambulatory Visit
Admission: EM | Admit: 2024-07-30 | Discharge: 2024-07-30 | Disposition: A | Discharge status: Home / Self Care | Attending: Family Medicine | Admitting: Family Medicine

## 2024-07-30 DIAGNOSIS — R102 Pelvic and perineal pain unspecified side: Secondary | ICD-10-CM | POA: Diagnosis not present

## 2024-07-30 LAB — POCT URINE DIPSTICK
Bilirubin, UA: NEGATIVE
Blood, UA: NEGATIVE
Glucose, UA: NEGATIVE mg/dL
Ketones, POC UA: NEGATIVE mg/dL
Leukocytes, UA: NEGATIVE
Nitrite, UA: NEGATIVE
Protein Ur, POC: NEGATIVE mg/dL
Spec Grav, UA: 1.02
Urobilinogen, UA: 0.2 U/dL
pH, UA: 6.5

## 2024-07-30 MED ORDER — METHOCARBAMOL 500 MG PO TABS: 500.0000 mg | ORAL_TABLET | Freq: Every evening | ORAL | 0 refills | Status: AC | PRN

## 2024-07-30 MED ORDER — NAPROXEN 500 MG PO TABS: 500.0000 mg | ORAL_TABLET | Freq: Two times a day (BID) | ORAL | 0 refills | Status: AC

## 2024-07-30 NOTE — ED Triage Notes (Signed)
 Patient to Urgent Care with complaints of lower abdominal/ pelvic pain. Describes it as a dull ache. Urinary frequency. Denies any fevers.   Symptoms x2 weeks.   Possible concern for an STD.

## 2024-07-30 NOTE — ED Provider Notes (Signed)
 " [facility]    CSN: [id number] Arrival date & time: 07/30/24  0906      History   Chief Complaint Chief Complaint  Patient presents with   Pelvic Pain    HPI  HPI  Ethan Brewer is a 24 y.o. male for pelvic floor pain at the base of his penis for the past 2 weeks. Reports no previous similar sx.  No treatments prior to arrival. Pain rated 3/10 that is described as dull and achy.  He is married. Ethan Brewer does not use condoms.  No history of kidney stones or hernia. Denies bulging or masses.  Has been doing a new regimen of squats just before the pain started.    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 Authorizing Provider  methocarbamol  (ROBAXIN ) 500 MG tablet Take 1 tablet (500 mg total) by mouth at bedtime as needed for muscle spasms. 07/30/24  Yes Kamdin Follett, DO  naproxen  (NAPROSYN ) 500 MG tablet Take 1 tablet (500 mg total) by mouth 2 (two) times daily with a meal. 07/30/24  Yes Garrit Marrow, DO  albuterol  (VENTOLIN  HFA) 108 (90 Base) MCG/ACT inhaler Inhale 2 puffs into the lungs every 6 (six) hours as needed for wheezing or shortness of breath. 10/24/22   Moishe Chiquita HERO, NP  amoxicillin -clavulanate (AUGMENTIN ) 875-125 MG tablet Take 1 tablet by mouth 2 (two) times  daily. Patient not taking: Reported on 07/30/2024 11/08/22   Gladis Elsie BROCKS, PA-C  fluticasone  (FLONASE ) 50 MCG/ACT nasal spray Place 2 sprays into both nostrils daily. 10/24/22   Moishe Chiquita HERO, NP  loratadine  (CLARITIN ) 10 MG tablet Take 1 tablet (10 mg total) by mouth daily. 10/24/22   Moishe Chiquita HERO, NP    Family History History reviewed. No pertinent family history.  Social History Social History[1]   Allergies   Prednisone    Review of Systems Review of Systems: negative unless otherwise stated in HPI.      Physical Exam Triage Vital Signs ED Triage Vitals  Encounter Vitals Group     BP 07/30/24 0918 (!) 144/89     Girls Systolic BP Percentile --      Girls Diastolic BP Percentile --      Boys Systolic BP Percentile --      Boys Diastolic BP Percentile --      Pulse Rate 07/30/24 0918 73     Resp 07/30/24 0918 18     Temp 07/30/24 0918 97.9 F (36.6 C)     Temp src --      SpO2 07/30/24 0918 100 %     Weight 07/30/24 0917 261 lb (118.4 kg)     Height --  Head Circumference --      Peak Flow --      Pain Score 07/30/24 0917 3     Pain Loc --      Pain Education --      Exclude from Growth Chart --    No data found.  Updated Vital Signs BP (!) 144/89   Pulse 73   Temp 97.9 F (36.6 C)   Resp 18   Wt 118.4 kg   SpO2 100%   BMI 38.54 kg/m   Visual Acuity Right Eye Distance:   Left Eye Distance:   Bilateral Distance:    Right Eye Near:   Left Eye Near:    Bilateral Near:     Physical Exam GEN: well appearing male in no acute distress  CVS: well perfused  RESP: speaking in full sentences without pause, no respiratory distress  ABD:  soft, non-tender, non distended, no CVA tenderness  GU: deferred, patient performed self swab  MSK:  normal ROM of LE, pelvis stable, pain below pubic symphysis    UC Treatments / Results  Labs (all labs ordered are listed, but only abnormal results are displayed) Labs Reviewed  POCT URINE DIPSTICK -  Normal  URINE CULTURE  CYTOLOGY, (ORAL, ANAL, URETHRAL) ANCILLARY ONLY    EKG   Radiology No results found.  Procedures Procedures (including critical care time)  Medications Ordered in UC Medications - No data to display  Initial Impression / Assessment and Plan / UC Course  I have reviewed the triage vital signs and the nursing notes.  Pertinent labs & imaging results that were available during my care of the patient were reviewed by me and considered in my medical decision making (see chart for details).      Patient is a 24 y.o.Ethan Brewer male  who presents for  pelvic floor pain.  Overall patient is well-appearing and afebrile.  Vital signs stable.  POC urine dipstick not consistent with acute cystitis.   Hematuria not present but no  microscopy available.  Treatment deferred for now. Urine culture obtained.  Follow-up sensitivities and change antibiotics, if needed. Aptima swab for trichomonas, gonorrhea and chlamydia obtained.  Patient does not request prophylactic STI treatment.  Discussed utility of pelvic xray but he declined. As he has been doing more squats, may have strained his adductor compartment.  Trial muscle relaxer and NSAIDs for now.     Return precautions including abdominal pain, fever, chills, nausea, or vomiting given. Discussed MDM, treatment plan and plan for follow-up with patient who agrees with plan.      Final Clinical Impressions(s) / UC Diagnoses   Final diagnoses:  Pelvic pain in male     Discharge Instructions      You did not have evidence of a urinary tract infection. I sent your urine for culture to be sure. We also collected a STD panel to be sure you have not been exposed.  Someone may call you to stop/change antibiotics based off your culture results.   For now, we will treat your pain as muscular with antiinflammatory medication (Naprosyn ) and muscle relaxer (Robaxin ).  Stop by the pharmacy to pick up your prescriptions.  Follow up  with your primary care provider or return to the urgent care, if not improving.       ED Prescriptions     Medication Sig Dispense Auth. Provider   naproxen  (NAPROSYN ) 500 MG tablet Take 1 tablet (500 mg total) by mouth 2 (two) times daily with a  meal. 30 tablet Jori Thrall, DO   methocarbamol  (ROBAXIN ) 500 MG tablet Take 1 tablet (500 mg total) by mouth at bedtime as needed for muscle spasms. 20 tablet Drayden Lukas, DO      PDMP not reviewed this encounter.     [1]  Social History Tobacco Use   Smoking status: Never   Smokeless tobacco: Never  Vaping Use   Vaping status: Never Used  Substance Use Topics   Alcohol use: Never   Drug use: Never     Mairany Bruno, DO 07/30/24 1034  "

## 2024-07-30 NOTE — Discharge Instructions (Signed)
 You did not have evidence of a urinary tract infection. I sent your urine for culture to be sure. We also collected a STD panel to be sure you have not been exposed.  Someone may call you to stop/change antibiotics based off your culture results.   For now, we will treat your pain as muscular with antiinflammatory medication (Naprosyn ) and muscle relaxer (Robaxin ).  Stop by the pharmacy to pick up your prescriptions.  Follow up with your primary care provider or return to the urgent care, if not improving.

## 2024-07-31 ENCOUNTER — Ambulatory Visit (HOSPITAL_COMMUNITY): Payer: Self-pay

## 2024-07-31 LAB — CYTOLOGY, (ORAL, ANAL, URETHRAL) ANCILLARY ONLY
Chlamydia: NEGATIVE
Comment: NEGATIVE
Comment: NEGATIVE
Comment: NORMAL
Neisseria Gonorrhea: NEGATIVE
Trichomonas: NEGATIVE

## 2024-07-31 LAB — URINE CULTURE: Culture: NO GROWTH

## 2024-08-11 ENCOUNTER — Inpatient Hospital Stay: Admission: RE | Admit: 2024-08-11 | Discharge: 2024-08-11

## 2024-08-11 VITALS — BP 151/83 | HR 79 | Temp 97.9°F | Resp 16

## 2024-08-11 DIAGNOSIS — N342 Other urethritis: Secondary | ICD-10-CM

## 2024-08-11 LAB — POCT URINE DIPSTICK
Bilirubin, UA: NEGATIVE
Blood, UA: NEGATIVE
Glucose, UA: NEGATIVE mg/dL
Ketones, POC UA: NEGATIVE mg/dL
Leukocytes, UA: NEGATIVE
Nitrite, UA: NEGATIVE
Protein Ur, POC: NEGATIVE mg/dL
Spec Grav, UA: 1.02
Urobilinogen, UA: 0.2 U/dL
pH, UA: 7

## 2024-08-11 MED ORDER — CIPROFLOXACIN HCL 500 MG PO TABS: 500.0000 mg | ORAL_TABLET | Freq: Two times a day (BID) | ORAL | 0 refills | Status: AC

## 2024-08-11 NOTE — Discharge Instructions (Addendum)
 Urine shows no sign of bacteria, will treat with cipro  for 7 days. If symptoms do not improve we should follow-up with PCP or urologist.

## 2024-08-11 NOTE — ED Triage Notes (Signed)
 Pt was seen 07/30/24 at [facility] urgent care for groin pain x 1 month. Pt was prescribed naproxen  and robaxin , but is not better.

## 2024-08-11 NOTE — ED Provider Notes (Signed)
 " Ethan Brewer    CSN: [id number] Arrival date & time: 08/11/24  1153      History   Chief Complaint Chief Complaint  Patient presents with   Groin Pain    Entered by patient   Urinary Frequency    HPI Jeramy Claw is a 24 y.o. male.   Pt presents today due to groin pain, dysuria, and lower abdominal pain that he has had for over a month. Pt was seen on 1/21 tested neg for STIs and neg urine culture. Pt states that he has prescribed Naproxen  and Robaxin  with no relief of symptoms. Pt denies injury to groin area, sexual activity with anyone besides his wife, penile discharge, testicular pain, or pain with ejaculation.   The history is provided by the patient.  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 Authorizing Provider  ciprofloxacin  (CIPRO ) 500 MG tablet Take 1 tablet (500 mg total) by mouth every 12 (twelve) hours for 7 days. 08/11/24 08/18/24 Yes Andra Corean BROCKS, PA-C  albuterol  (VENTOLIN  HFA) 108 (90 Base) MCG/ACT inhaler Inhale 2 puffs into the lungs every 6 (six) hours as needed for wheezing or shortness of breath. 10/24/22   Moishe Chiquita HERO, NP  amoxicillin -clavulanate (AUGMENTIN ) 875-125 MG tablet Take 1 tablet by mouth 2 (two) times daily. Patient not taking: Reported on 07/30/2024 11/08/22   Gladis Elsie BROCKS, PA-C  fluticasone  (FLONASE ) 50 MCG/ACT nasal spray Place 2 sprays into both nostrils daily. 10/24/22   Moishe Chiquita HERO, NP  loratadine  (CLARITIN ) 10 MG tablet Take 1 tablet (10 mg  total) by mouth daily. 10/24/22   Moishe Chiquita HERO, NP  methocarbamol  (ROBAXIN ) 500 MG tablet Take 1 tablet (500 mg total) by mouth at bedtime as needed for muscle spasms. 07/30/24   Brimage, Vondra, DO  naproxen  (NAPROSYN ) 500 MG tablet Take 1 tablet (500 mg total) by mouth 2 (two) times daily with a meal. 07/30/24   Kriste Berth, DO    Family History History reviewed. No pertinent family history.  Social History Social History[1]   Allergies   Prednisone    Review of Systems Review of Systems  Genitourinary:  Positive for frequency.     Physical Exam Triage Vital Signs ED Triage Vitals  Encounter Vitals Group     BP 08/11/24 1159 (!) 151/83     Girls Systolic BP Percentile --      Girls Diastolic BP Percentile --      Boys Systolic BP Percentile --      Boys Diastolic BP Percentile --      Pulse Rate 08/11/24 1159 79     Resp 08/11/24 1159 16     Temp 08/11/24 1159 97.9 F (36.6 C)     Temp Source 08/11/24 1159 Oral     SpO2 08/11/24 1159 96 %     Weight --      Height --      Head Circumference --  Peak Flow --      Pain Score 08/11/24 1157 4     Pain Loc --      Pain Education --      Exclude from Growth Chart --    No data found.  Updated Vital Signs BP (!) 151/83 (BP Location: Right Arm)   Pulse 79   Temp 97.9 F (36.6 C) (Oral)   Resp 16   SpO2 96%   Visual Acuity Right Eye Distance:   Left Eye Distance:   Bilateral Distance:    Right Eye Near:   Left Eye Near:    Bilateral Near:     Physical Exam Vitals and nursing note reviewed.  Constitutional:      General: He is not in acute distress.    Appearance: Normal appearance. He is not ill-appearing, toxic-appearing or diaphoretic.  Eyes:     General: No scleral icterus. Cardiovascular:     Rate and Rhythm: Normal rate and regular rhythm.     Heart sounds: Normal heart sounds.  Pulmonary:     Effort: Pulmonary effort is normal. No respiratory distress.     Breath sounds: Normal breath  sounds. No wheezing or rhonchi.  Abdominal:     General: Abdomen is flat. Bowel sounds are normal.     Palpations: Abdomen is soft.     Tenderness: There is abdominal tenderness in the suprapubic area. There is no right CVA tenderness or left CVA tenderness.  Skin:    General: Skin is warm.  Neurological:     Mental Status: He is alert and oriented to person, place, and time.  Psychiatric:        Mood and Affect: Mood normal.        Behavior: Behavior normal.      UC Treatments / Results  Labs (all labs ordered are listed, but only abnormal results are displayed) Labs Reviewed  POCT URINE DIPSTICK - Normal    EKG   Radiology No results found.  Procedures Procedures (including critical care time)  Medications Ordered in UC Medications - No data to display  Initial Impression / Assessment and Plan / UC Course  I have reviewed the triage vital signs and the nursing notes.  Pertinent labs & imaging results that were available during my care of the patient were reviewed by me and considered in my medical decision making (see chart for details).      Final Clinical Impressions(s) / UC Diagnoses   Final diagnoses:  Urethritis     Discharge Instructions      Urine shows no sign of bacteria, will treat with cipro  for 7 days. If symptoms do not improve we should follow-up with PCP or urologist.     ED Prescriptions     Medication Sig Dispense Auth. Provider   ciprofloxacin  (CIPRO ) 500 MG tablet Take 1 tablet (500 mg total) by mouth every 12 (twelve) hours for 7 days. 14 tablet Andra Corean BROCKS, PA-C      PDMP not reviewed this encounter.    [1]  Social History Tobacco Use   Smoking status: Never   Smokeless tobacco: Never  Vaping Use   Vaping status: Never Used  Substance Use Topics   Alcohol use: Never   Drug use: Never     Andra Corean BROCKS, PA-C 08/11/24 1255  "
